# Patient Record
Sex: Male | Born: 1950 | Race: White | Hispanic: No | Marital: Married
Health system: Midwestern US, Community
[De-identification: ages and names within clinical notes are randomized; demographics above are authoritative.]

## PROBLEM LIST (undated history)

## (undated) DIAGNOSIS — M170 Bilateral primary osteoarthritis of knee: Secondary | ICD-10-CM

---

## 2018-01-08 NOTE — Telephone Encounter (Signed)
noted 

## 2018-01-08 NOTE — Telephone Encounter (Signed)
Pt Name and Date of Birth Verified?  Recieived referral:  ____Internal  __X__ External (Name/hone/fax)  ____[facility])    DX:Bilat knee pain   [facility]   - Pt instructed to bring disc if outside of [employer] to appointment-Sending Disk     Is this Workers Comp?No     Have you seen a prior provider for this DX?   --If yes Where__________________    Records given to:  Elnita Maxwell   CB# [phone]  Scheduled 01/21/18 at 9:15[facility]  Thank you   Andris Baumann

## 2018-01-19 NOTE — Telephone Encounter (Signed)
[facility]'d [facility], given to Hulda Marin 01/19/18 6:56 AM

## 2018-01-21 ENCOUNTER — Ambulatory Visit: Admit: 2018-01-21 | Discharge: 2018-01-21 | Payer: TRICARE (CHAMPUS) | Attending: Orthopaedic Surgery

## 2018-01-21 ENCOUNTER — Ambulatory Visit: Attending: Orthopaedic Surgery

## 2018-01-21 DIAGNOSIS — M25561 Pain in right knee: Secondary | ICD-10-CM

## 2018-01-21 NOTE — Progress Notes (Signed)
This is an initial consultation request sent over by Dr. Shelah Lewandowsky at the [facility] in [place] for evaluation and treatment in of chronic bilateral knee pain.  Please cc primary care provider outlining our findings diagnostic imaging and treatment recommendations.  Please thank them for sending this consultation.    CC: Bilateral knee pain    HPI: The patient is a 67 y.o.-year-old male who presents today for their first orthopaedic evaluation ofbilateral knee  pain.  The patient states that the pain has been an issue for many years.  Patient states he has chronic ongoing lifestyle limiting knee pain that has been recalcitrant to nonoperative measures to include anti-inflammatories activity modification bracing and cortisone injections.  He states the cortisone injections he last had was last fall and they do not work as well as they had been previously.  Patient also has spine issues to include a previous spine fracture in a cervical spine and status post multiple surgeries.  He currently gets cortisone injections into his spine which do work for the back pain however he was told to come and see [place] in consultation for his ongoing knee pain for potential other treatment options. Patient does not describe any instability but he does have some mechanical symptoms to include clicking and locking and crepitus.. Currently the pain is rated at 5/10. The pain is described as sharp in nature on occasion with twisting but otherwise dull and achy with some mechanical symptoms.  Pain is worse with walking and prolonged activity and improved by cortisone injections previously.No injury. No recent illness, fevers, chills or night sweats.    History reviewed. No pertinent past medical history.    Current Outpatient Medications:   ???  ergocalciferol (ERGOCALCIFEROL) 50,000 unit capsule, , Disp: , Rfl:   ???  fluticasone propionate (FLONASE) 50 mcg/actuation nasal spray, , Disp: , Rfl:    ???  losartan (COZAAR) 100 mg tablet, , Disp: , Rfl:   ???  TOPROL XL 200 mg XL tablet, , Disp: , Rfl:   ???  pantoprazole (PROTONIX) 40 mg tablet, , Disp: , Rfl:   ???  pravastatin (PRAVACHOL) 80 mg tablet, , Disp: , Rfl:   ???  sertraline (ZOLOFT) 25 mg tablet, , Disp: , Rfl:   ???  cyanocobalamin (VITAMIN B-12) 500 mcg tablet, Take 500 mcg by mouth daily., Disp: , Rfl:   Allergies   Allergen Reactions   ??? Versed [Midazolam] Other (comments)     Anger, agitation     History reviewed. No pertinent surgical history.  History reviewed. No pertinent family history.  Social History     Socioeconomic History   ??? Marital status: MARRIED     Spouse name: Not on file   ??? Number of children: Not on file   ??? Years of education: Not on file   ??? Highest education level: Not on file   Occupational History   ??? Not on file   Social Needs   ??? Financial resource strain: Not on file   ??? Food insecurity:     Worry: Not on file     Inability: Not on file   ??? Transportation needs:     Medical: Not on file     Non-medical: Not on file   Tobacco Use   ??? Smoking status: Never Smoker   ??? Smokeless tobacco: Never Used   Substance and Sexual Activity   ??? Alcohol use: Not on file   ??? Drug use: Not on file   ???  Sexual activity: Not on file   Lifestyle   ??? Physical activity:     Days per week: Not on file     Minutes per session: Not on file   ??? Stress: Not on file   Relationships   ??? Social connections:     Talks on phone: Not on file     Gets together: Not on file     Attends religious service: Not on file     Active member of club or organization: Not on file     Attends meetings of clubs or organizations: Not on file     Relationship status: Not on file   ??? Intimate partner violence:     Fear of current or ex partner: Not on file     Emotionally abused: Not on file     Physically abused: Not on file     Forced sexual activity: Not on file   Other Topics Concern   ??? Not on file   Social History Narrative   ??? Not on file        ROS: A 12-point review of systems was reviewed with the patient and is available in further detail in the chart.    Examination:   Visit Vitals  BP 108/70 (BP 1 Location: Right arm, BP Patient Position: Sitting)   Pulse 76   Resp 16   Ht  (1.778 m)   Wt 221 lb (100.2 kg)   BMI 31.71 kg/m??     The patient is awake, alert and oriented times 3 and answering questions appropriately. The patient is atraumatic, normocephalic, extraocular muscles are intact, affect is friendly and breathing is unlaborded.  Examination of bilateral lower extremities reveals neurovascularly intact limbs. Skin is warm, dry and intact. There is no effusion. Range of motion of the both knees is from 0 - 120. There is 5/5 muscle strength in EHL/FHL. There is no ligamentous laxity. Negative anterior and posterior draw. No laxity to valgus or varus stress at both 0 and 30 degrees. Sensation is intact. No lower extremity edema.   Overall the patient has no effusion today is stable ligamentous exam with an excellent range of motion. He has some medial joint line tenderness to palpation and some mild tenderness to palpation over bilateral patellar tendons but no other bony tenderness to palpation or bony prominences or tendinous insertions.  He otherwise remains distally neurovascular intact without venous stasis lymphedema or venous varicosities.    Radiographs:  My interpretation of outside radiographs that the patient provided [place] today show subchondral sclerosis osteophytosis and joint space narrowing bilaterally indicative of primary osteoarthritis Kellgren Lawrence grade 3.    Assessment:  Bilateral knee primary osteoarthritis    Plan:   I discussed the nature of this problem with the patient. We discussed treatment options which include cortisone injection, viscosupplementation, oral anti-inflammatories, glucosamine and chondroitin, tumeric, activity modification, and ultimately joint replacement. We have decided to proceed  with maximization of non operative measures with continued use of nutritional supplements, over-the-counter pain medications neoprene sleeves if indicated and patient desires as well as we have elected to proceed with hyaluronic acid injections.  The patient desires to proceed his insurance will cover however he is going out of town as he works currently as a Theme park manager and so he is going to schedule these for a later date.  I advised that the process of a 3 injection series and the patient desires to proceed. At this point I do  not think he needs a knee replacement so we are going to continue with her current regimen of nonoperative measures.  I also described for him genicular nerve rhizotomy in this may be something he may entertain in the future.  I will cc a copy this the primary care provider as well as the consulting thanking him for the consultation outlining our findings and letting know we will continue to see this patient for bilateral knee primary osteoarthritis.    The patient understands the plan and will follow up accordingly.

## 2018-02-27 ENCOUNTER — Encounter

## 2018-02-27 ENCOUNTER — Institutional Professional Consult (permissible substitution): Admit: 2018-02-27 | Discharge: 2018-02-27 | Attending: Orthopaedic Surgery

## 2018-02-27 DIAGNOSIS — M170 Bilateral primary osteoarthritis of knee: Secondary | ICD-10-CM

## 2018-02-27 MED ORDER — SODIUM HYALURONATE 16.8 MG/2 ML INTRA-ARTICULAR SYRINGE
16.8 mg/2 mL | Freq: Once | INTRA_ARTICULAR | Status: AC
Start: 2018-02-27 — End: 2018-02-27
  Administered 2018-02-27: 13:00:00 via INTRA_ARTICULAR

## 2018-02-27 NOTE — Progress Notes (Signed)
CC: Gelsyn Injection #2 Bilateral knees    HPI:  The patient is a 67 y.o.-year-old male who presents today for gelsyn injection #2 into Bilateral knees. Pain today is reported at 6/10.    Examination:  Examination of bilateral lower extremities reveals neurovascularly intact limbs. Skin is cool, dry and intact. There is no knee effusion    Assessment: Bilateral knee primary osteoarthritis     Plan: I advised the patient of the risks, benefits and alternatives to Gelsyn injection (to include redness, pain, swelling, flare pain and infection). Consent was signed for bilateral knee Gelsyn injection. After site verification under sterile conditions, 3 mL of lidocaine was pre-injected and then 2mL of Gelsyn was injected intra-articularly into the right knee. The patient tolerated the procedure well. Typical postinjection instructions were given.    Then under sterile conditions, 3 mL of lidocaine was pre-injected and then 2mL of Gelsyn was injected intra-articularly into the left knee. The patient tolerated the procedure well. Typical postinjection instructions were given.    Follow-up 1 week

## 2018-02-27 NOTE — Progress Notes (Signed)
Procedure Time       Pre-procedure checklist:      [x]   Patient identity confirmed using (2) patient identifiers  [x]   Documenation in chart agrees with intended procedure  [x]   Consent is signed and agrees with chart and patient  [x]   Relevant images and test results are labeled and correctly displayed  []   Any required special equipment, blood products, devices, and implants for this           procedure are available and accurately matched to the patient  []   Site is marked as required per policy    Confirmation that patient has taken prescribed pre-procedure medications:    []   Ciprofloxacin 500 mg oral taken prior to the procedure today  []   Levaquin 500 mg oral taken prior to procedure   []   Lorazepam     mg oral taken prior othe procedure today. Confirm paitent has a driver       with him/her today   []   Macrobid 250 mg oral taken prior to the procedure today  []   Other ( list exact name and dose) oral taken prior to procedure day:  []   No premedications ordered    Timeout completed by the provider and assistant immediatily prior to the procedure:    [x]   Patient identity confirmed using (2) patient identifiers  [x]   Documentation in chart agrees with intended proceudre  [x]   Consent is signed and agrees with chart and patient   [x]   Correct site and side has been marked by provider  []   Relevant images and test results are labeled and correctly displayed  []   Any required special equipment, blood products, devices, and implants for this                 procedure are available and accurately matched to the patient  []   Confirm the need to administer antibiotics or fluids for irrigation purposes  []   Any safety practices needed based on patient history or medication use      1% lidocaine lot # [id number] exp 07/10/2020, ndc [id number] [employer] ,gelsyn lot # [id number] exp 08/08/20, bilateral knee  Zadie Rhine      Self Administered or Unclassified Drugs  Self Administered or Unclassified  Drugs  Medication Name: 1% lidocaine  Expiration Date: 07/10/20  Dose: 3cc  Manufacturer: [employer]  Lot#: [id number]  NDC#: 84132-440-10  Performed by/Witnessed by: DrNewman/cfarris, each knee

## 2018-02-27 NOTE — Progress Notes (Signed)
CC: Gelsyn Injection #2 Bilateral knees    HPI:  The patient is a 67 y.o.-year-old male who presents today for gelsyn injection #2 into Bilateral knees. Pain today is reported at 6/10.    Examination:  Examination of bilateral lower extremities reveals neurovascularly intact limbs. Skin is cool, dry and intact. There is no knee effusion    Assessment: Bilateral knee primary osteoarthritis     Plan: I advised the patient of the risks, benefits and alternatives to Gelsyn injection (to include redness, pain, swelling, flare pain and infection). Consent was signed for bilateral knee Gelsyn injection. After site verification under sterile conditions, 3 mL of lidocaine was pre-injected and then 2mL of Gelsyn was injected intra-articularly into the right knee. The patient tolerated the procedure well. Typical postinjection instructions were given.    Then under sterile conditions, 3 mL of lidocaine was pre-injected and then 2mL of Gelsyn was injected intra-articularly into the left knee. The patient tolerated the procedure well. Typical postinjection instructions were given.    Follow-up 1 week

## 2018-02-27 NOTE — Progress Notes (Signed)
Procedure Time       Pre-procedure checklist:      [x]   Patient identity confirmed using (2) patient identifiers  [x]   Documenation in chart agrees with intended procedure  [x]   Consent is signed and agrees with chart and patient  [x]   Relevant images and test results are labeled and correctly displayed  []   Any required special equipment, blood products, devices, and implants for this           procedure are available and accurately matched to the patient  []   Site is marked as required per policy    Confirmation that patient has taken prescribed pre-procedure medications:    []   Ciprofloxacin 500 mg oral taken prior to the procedure today  []   Levaquin 500 mg oral taken prior to procedure   []   Lorazepam     mg oral taken prior othe procedure today. Confirm paitent has a driver       with him/her today   []   Macrobid 250 mg oral taken prior to the procedure today  []   Other ( list exact name and dose) oral taken prior to procedure day:  []   No premedications ordered    Timeout completed by the provider and assistant immediatily prior to the procedure:    [x]   Patient identity confirmed using (2) patient identifiers  [x]   Documentation in chart agrees with intended proceudre  [x]   Consent is signed and agrees with chart and patient   [x]   Correct site and side has been marked by provider  []   Relevant images and test results are labeled and correctly displayed  []   Any required special equipment, blood products, devices, and implants for this                 procedure are available and accurately matched to the patient  []   Confirm the need to administer antibiotics or fluids for irrigation purposes  []   Any safety practices needed based on patient history or medication use      1% lidocaine lot # [id number] exp 07/10/2020, ndc [id number] [employer] ,gelsyn lot # [id number] exp 08/08/20, bilateral knee  Andrew Daniels      Self Administered or Unclassified Drugs  Self Administered or Unclassified Drugs   Medication Name: 1% lidocaine  Expiration Date: 07/10/20  Dose: 3cc  Manufacturer: [employer]  Lot#: [id number]  NDC#: [id number]  Performed by/Witnessed by: DrNewman/cfarris, each knee

## 2018-03-06 ENCOUNTER — Encounter

## 2018-03-06 ENCOUNTER — Institutional Professional Consult (permissible substitution): Admit: 2018-03-06 | Discharge: 2018-03-06 | Attending: Orthopaedic Surgery

## 2018-03-06 DIAGNOSIS — M170 Bilateral primary osteoarthritis of knee: Secondary | ICD-10-CM

## 2018-03-06 MED ORDER — SODIUM HYALURONATE 16.8 MG/2 ML INTRA-ARTICULAR SYRINGE
16.8 mg/2 mL | Freq: Once | INTRA_ARTICULAR | Status: AC
Start: 2018-03-06 — End: 2018-03-06
  Administered 2018-03-06: 14:00:00 via INTRA_ARTICULAR

## 2018-03-06 NOTE — Progress Notes (Signed)
CC: Gelsyn Injection #2 Bilateral knees    HPI:  The patient is a 67 y.o.-year-old male who presents today for gelsyn injection #2 into Bilateral knees. Pain today is reported at 4/10.    Examination:  Examination of bilateral lower extremities reveals neurovascularly intact limbs. Skin is cool, dry and intact. There is no knee effusion    Assessment: Bilateral knee primary osteoarthritis     Plan: I advised the patient of the risks, benefits and alternatives to Gelsyn injection (to include redness, pain, swelling, flare pain and infection). Consent was signed for bilateral knee Gelsyn injection. After site verification under sterile conditions, 3 mL of lidocaine was pre-injected and then 2mL of Gelsyn was injected intra-articularly into the right knee. The patient tolerated the procedure well. Typical postinjection instructions were given.    Then under sterile conditions, 3 mL of lidocaine was pre-injected and then 2mL of Gelsyn was injected intra-articularly into the left knee. The patient tolerated the procedure well. Typical postinjection instructions were given.    Follow-up 1 week

## 2018-03-06 NOTE — Progress Notes (Signed)
Procedure Time       Pre-procedure checklist:      [x]   Patient identity confirmed using (2) patient identifiers  [x]   Documenation in chart agrees with intended procedure  [x]   Consent is signed and agrees with chart and patient  [x]   Relevant images and test results are labeled and correctly displayed  []   Any required special equipment, blood products, devices, and implants for this           procedure are available and accurately matched to the patient  []   Site is marked as required per policy    Confirmation that patient has taken prescribed pre-procedure medications:    []   Ciprofloxacin 500 mg oral taken prior to the procedure today  []   Levaquin 500 mg oral taken prior to procedure   []   Lorazepam     mg oral taken prior othe procedure today. Confirm paitent has a driver       with him/her today   []   Macrobid 250 mg oral taken prior to the procedure today exp /  []   Other ( list exact name and dose) oral taken prior to procedure day:  []   No premedications ordered    Timeout completed by the provider and assistant immediatily prior to the procedure:    [x]   Patient identity confirmed using (2) patient identifiers  [x]   Documentation in chart agrees with intended proceudre  [x]   Consent is signed and agrees with chart and patient   [x]   Correct site and side has been marked by provider  []   Relevant images and test results are labeled and correctly displayed  []   Any required special equipment, blood products, devices, and implants for this                 procedure are available and accurately matched to the patient  []   Confirm the need to administer antibiotics or fluids for irrigation purposes  []   Any safety practices needed based on patient history or medication use    1% lidocaine lot # [id number] exp 07/10/2020, ndc [id number] [employer] ,[place] lot # [id number] exp 10/09/2009, bilateral knee  Zadie Rhineheryl Huntley Knoop         Self Administered or Unclassified Drugs   Self Administered or Unclassified Drugs  Medication Name: 1% lidocaine  Expiration Date: 07/10/20  Dose: 3cc  Manufacturer: [employer]  Lot#: [id number]  NDC#: [id number]  Performed by/Witnessed by: DRNewman/cfarris each knee

## 2018-03-06 NOTE — Progress Notes (Signed)
CC: Gelsyn Injection #2 Bilateral knees    HPI:  The patient is a 67 y.o.-year-old male who presents today for gelsyn injection #2 into Bilateral knees. Pain today is reported at 4/10.    Examination:  Examination of bilateral lower extremities reveals neurovascularly intact limbs. Skin is cool, dry and intact. There is no knee effusion    Assessment: Bilateral knee primary osteoarthritis     Plan: I advised the patient of the risks, benefits and alternatives to Gelsyn injection (to include redness, pain, swelling, flare pain and infection). Consent was signed for bilateral knee Gelsyn injection. After site verification under sterile conditions, 3 mL of lidocaine was pre-injected and then 2mL of Gelsyn was injected intra-articularly into the right knee. The patient tolerated the procedure well. Typical postinjection instructions were given.    Then under sterile conditions, 3 mL of lidocaine was pre-injected and then 2mL of Gelsyn was injected intra-articularly into the left knee. The patient tolerated the procedure well. Typical postinjection instructions were given.    Follow-up 1 week

## 2018-03-06 NOTE — Progress Notes (Signed)
Procedure Time       Pre-procedure checklist:      [x]   Patient identity confirmed using (2) patient identifiers  [x]   Documenation in chart agrees with intended procedure  [x]   Consent is signed and agrees with chart and patient  [x]   Relevant images and test results are labeled and correctly displayed  []   Any required special equipment, blood products, devices, and implants for this           procedure are available and accurately matched to the patient  []   Site is marked as required per policy    Confirmation that patient has taken prescribed pre-procedure medications:    []   Ciprofloxacin 500 mg oral taken prior to the procedure today  []   Levaquin 500 mg oral taken prior to procedure   []   Lorazepam     mg oral taken prior othe procedure today. Confirm paitent has a driver       with him/her today   []   Macrobid 250 mg oral taken prior to the procedure today exp /  []   Other ( list exact name and dose) oral taken prior to procedure day:  []   No premedications ordered    Timeout completed by the provider and assistant immediatily prior to the procedure:    [x]   Patient identity confirmed using (2) patient identifiers  [x]   Documentation in chart agrees with intended proceudre  [x]   Consent is signed and agrees with chart and patient   [x]   Correct site and side has been marked by provider  []   Relevant images and test results are labeled and correctly displayed  []   Any required special equipment, blood products, devices, and implants for this                 procedure are available and accurately matched to the patient  []   Confirm the need to administer antibiotics or fluids for irrigation purposes  []   Any safety practices needed based on patient history or medication use    1% lidocaine lot # [id number] exp 07/10/2020, ndc [id number] [employer] ,[place] lot # [id number] exp 10/09/2009, bilateral knee  Zadie Rhineheryl Farris         Self Administered or Unclassified Drugs  Self Administered or Unclassified  Drugs  Medication Name: 1% lidocaine  Expiration Date: 07/10/20  Dose: 3cc  Manufacturer: [employer]  Lot#: [id number]  NDC#: [id number]  Performed by/Witnessed by: DRNewman/cfarris each knee

## 2018-03-16 NOTE — Telephone Encounter (Signed)
Called and moved

## 2018-03-16 NOTE — Telephone Encounter (Signed)
Andrew Daniels is a 67 y.o. male     Patient calling ,he needs to r/s his   #3 Gelsyn injection on 03/18/18    He could do 03/18/18 late afternoon , but he has a work conflict and can not do the morning  He thinks he would be able to do Friday afternoon    Please call with new appt  I did try to call to see where I could add him but no answer , sorry    Telephone Information:   Mobile [phone]     Threasa Beardsarol A Leblanc

## 2018-03-18 ENCOUNTER — Institutional Professional Consult (permissible substitution): Admit: 2018-03-18 | Discharge: 2018-03-18 | Payer: TRICARE (CHAMPUS) | Attending: Orthopaedic Surgery

## 2018-03-18 DIAGNOSIS — M170 Bilateral primary osteoarthritis of knee: Secondary | ICD-10-CM

## 2018-03-18 MED ORDER — SODIUM HYALURONATE 16.8 MG/2 ML INTRA-ARTICULAR SYRINGE
16.8 mg/2 mL | Freq: Once | INTRA_ARTICULAR | Status: AC
Start: 2018-03-18 — End: 2018-03-18
  Administered 2018-03-18: 20:00:00 via INTRA_ARTICULAR

## 2018-03-18 NOTE — Progress Notes (Signed)
Procedure Time       Pre-procedure checklist:      [x]   Patient identity confirmed using (2) patient identifiers  [x]   Documenation in chart agrees with intended procedure  [x]   Consent is signed and agrees with chart and patient  [x]   Relevant images and test results are labeled and correctly displayed  []   Any required special equipment, blood products, devices, and implants for this           procedure are available and accurately matched to the patient  []   Site is marked as required per policy    Confirmation that patient has taken prescribed pre-procedure medications:    []   Ciprofloxacin 500 mg oral taken prior to the procedure today  []   Levaquin 500 mg oral taken prior to procedure   []   Lorazepam     mg oral taken prior othe procedure today. Confirm paitent has a driver       with him/her today   []   Macrobid 250 mg oral taken prior to the procedure today  []   Other ( list exact name and dose) oral taken prior to procedure day:  []   No premedications ordered    Timeout completed by the provider and assistant immediatily prior to the procedure:    [x]   Patient identity confirmed using (2) patient identifiers  [x]   Documentation in chart agrees with intended proceudre  [x]   Consent is signed and agrees with chart and patient   [x]   Correct site and side has been marked by provider  []   Relevant images and test results are labeled and correctly displayed  []   Any required special equipment, blood products, devices, and implants for this                 procedure are available and accurately matched to the patient  []   Confirm the need to administer antibiotics or fluids for irrigation purposes  []   Any safety practices needed based on patient history or medication use    1% lidocaine lot # [id number] exp 07/10/2020, ndc [id number] [employer] ,[place] lot # [id number] exp 10/09/2020  Zadie Rhineheryl Saksham Akkerman    Self Administered or Unclassified Drugs  Self Administered or Unclassified Drugs   Medication Name: 1% lidocaine  Expiration Date: 07/10/20  Dose: 3cc  Manufacturer: [employer]  Lot#: [id number]  NDC#: [id number]  Performed by/Witnessed by: Dr Nadara EatonNewman/cfarris,each knee

## 2018-03-18 NOTE — Progress Notes (Signed)
CC: Gelsyn Injection #3 Bilateral knees    HPI:  The patient is a 67 y.o.-year-old male who presents today for gelsyn injection #3 into Bilateral knees. Pain today is reported at 0/10.    Examination:  Examination of bilateral lower extremities reveals neurovascularly intact limbs. Skin is cool, dry and intact. There is no knee effusion    Assessment: Bilateral knee primary osteoarthritis     Plan: I advised the patient of the risks, benefits and alternatives to Gelsyn injection (to include redness, pain, swelling, flare pain and infection). Consent was signed for bilateral knee Gelsyn injection. After site verification under sterile conditions, 3 mL of lidocaine was pre-injected and then 2mL of Gelsyn was injected intra-articularly into the right knee. The patient tolerated the procedure well. Typical postinjection instructions were given.    Then under sterile conditions, 3 mL of lidocaine was pre-injected and then 2mL of Gelsyn was injected intra-articularly into the left knee. The patient tolerated the procedure well. Typical postinjection instructions were given.    Follow-up 3 months

## 2018-03-18 NOTE — Progress Notes (Signed)
Procedure Time       Pre-procedure checklist:      [x]   Patient identity confirmed using (2) patient identifiers  [x]   Documenation in chart agrees with intended procedure  [x]   Consent is signed and agrees with chart and patient  [x]   Relevant images and test results are labeled and correctly displayed  []   Any required special equipment, blood products, devices, and implants for this           procedure are available and accurately matched to the patient  []   Site is marked as required per policy    Confirmation that patient has taken prescribed pre-procedure medications:    []   Ciprofloxacin 500 mg oral taken prior to the procedure today  []   Levaquin 500 mg oral taken prior to procedure   []   Lorazepam     mg oral taken prior othe procedure today. Confirm paitent has a driver       with him/her today   []   Macrobid 250 mg oral taken prior to the procedure today  []   Other ( list exact name and dose) oral taken prior to procedure day:  []   No premedications ordered    Timeout completed by the provider and assistant immediatily prior to the procedure:    [x]   Patient identity confirmed using (2) patient identifiers  [x]   Documentation in chart agrees with intended proceudre  [x]   Consent is signed and agrees with chart and patient   [x]   Correct site and side has been marked by provider  []   Relevant images and test results are labeled and correctly displayed  []   Any required special equipment, blood products, devices, and implants for this                 procedure are available and accurately matched to the patient  []   Confirm the need to administer antibiotics or fluids for irrigation purposes  []   Any safety practices needed based on patient history or medication use    1% lidocaine lot # [id number] exp 07/10/2020, ndc [id number] [employer] ,[place] lot # [id number] exp 10/09/2020  Zadie Rhineheryl Farris    Self Administered or Unclassified Drugs  Self Administered or Unclassified Drugs  Medication Name: 1%  lidocaine  Expiration Date: 07/10/20  Dose: 3cc  Manufacturer: [employer]  Lot#: [id number]  NDC#: [id number]  Performed by/Witnessed by: Dr Nadara EatonNewman/cfarris,each knee

## 2018-03-18 NOTE — Progress Notes (Signed)
CC: Gelsyn Injection #3 Bilateral knees    HPI:  The patient is a 67 y.o.-year-old male who presents today for gelsyn injection #3 into Bilateral knees. Pain today is reported at 0/10.    Examination:  Examination of bilateral lower extremities reveals neurovascularly intact limbs. Skin is cool, dry and intact. There is no knee effusion    Assessment: Bilateral knee primary osteoarthritis     Plan: I advised the patient of the risks, benefits and alternatives to Gelsyn injection (to include redness, pain, swelling, flare pain and infection). Consent was signed for bilateral knee Gelsyn injection. After site verification under sterile conditions, 3 mL of lidocaine was pre-injected and then 2mL of Gelsyn was injected intra-articularly into the right knee. The patient tolerated the procedure well. Typical postinjection instructions were given.    Then under sterile conditions, 3 mL of lidocaine was pre-injected and then 2mL of Gelsyn was injected intra-articularly into the left knee. The patient tolerated the procedure well. Typical postinjection instructions were given.    Follow-up 3 months

## 2018-11-30 ENCOUNTER — Encounter: Attending: Neurology

## 2018-12-28 ENCOUNTER — Encounter: Attending: Neurology

## 2019-02-15 ENCOUNTER — Ambulatory Visit: Payer: TRICARE (CHAMPUS) | Attending: Neurology

## 2021-01-04 IMAGING — MR MRI LEFT KNEE WITHOUT CONTRAST
4 of 5 series · 26 of 40 positions shown · IV contrast (gadolinium)
Comparison: 11/30/2020 [facility] radiographs

MRI LEFT KNEE WITHOUT CONTRAST, 01/04/2021 [DATE]: 
CLINICAL INDICATION: Left knee meniscus tear.
TECHNIQUE: Multiplanar, multiecho position MR images of the knee were performed 
without intravenous gadolinium enhancement.

[Series 201: survey_ · axial · 10.0mm · 1.17mm/px · z∈[-20,+149]mm · 3 of 9 slices shown]
[im 1/9]
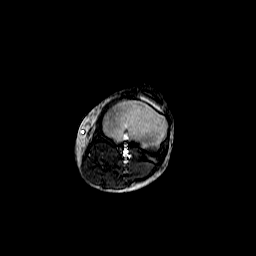
[im 5/9]
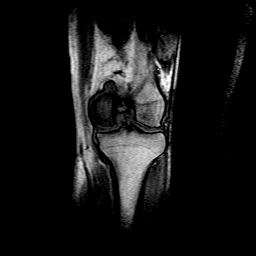
[im 9/9]
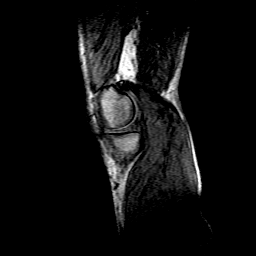

[Series 301: pdw spair sag · sagittal · 3.0mm · 0.50mm/px · 9 of 30 slices shown]
[im 1/30]
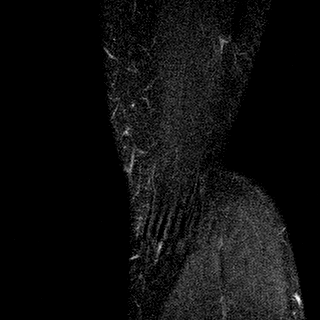
[im 4/30]
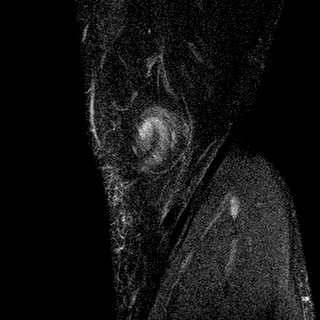
[im 8/30]
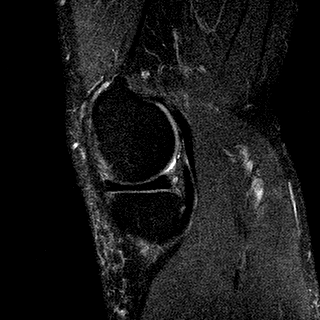
[im 11/30]
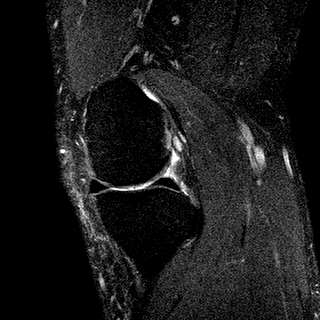
[im 15/30]
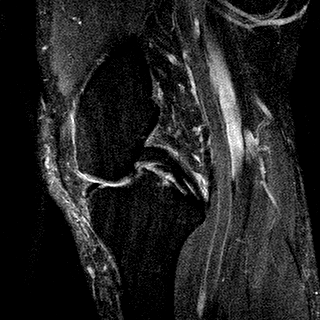
[im 19/30]
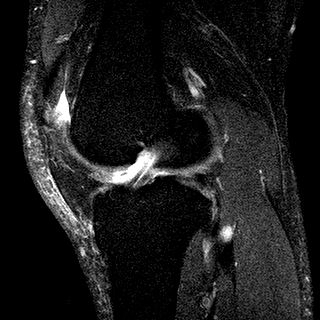
[im 22/30]
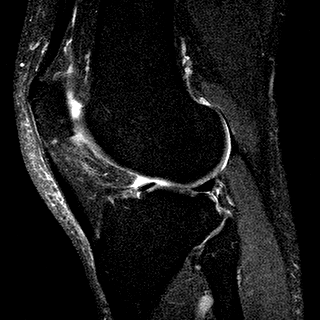
[im 26/30]
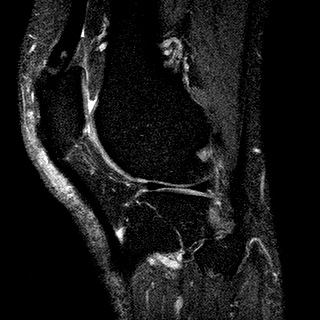
[im 30/30]
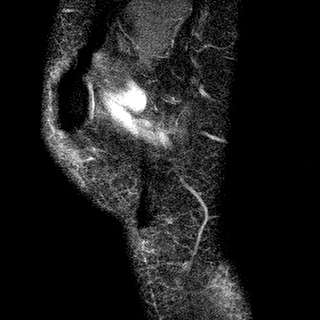

[Series 401: T1 · coronal · 3.0mm · 0.31mm/px · 9 of 33 slices shown]
[im 1/33]
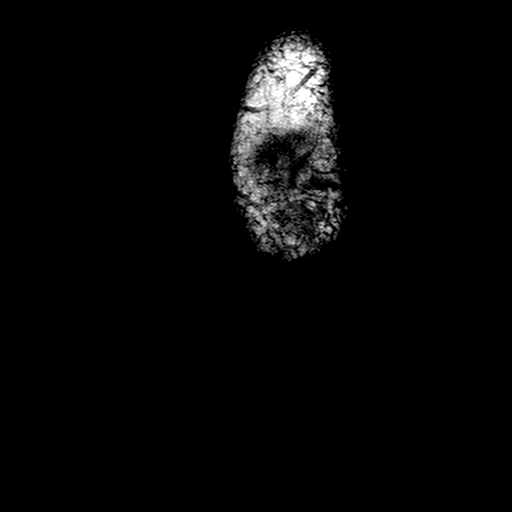
[im 5/33]
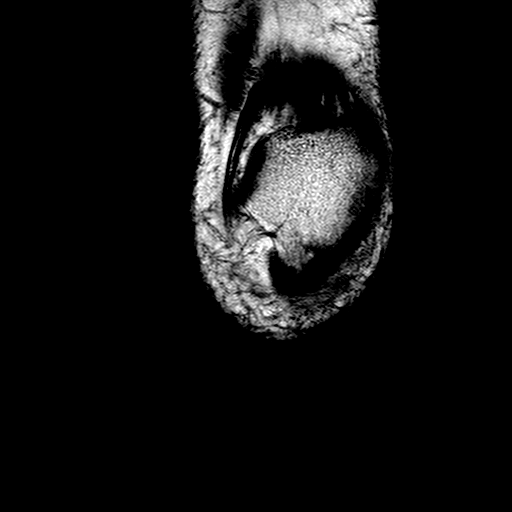
[im 9/33]
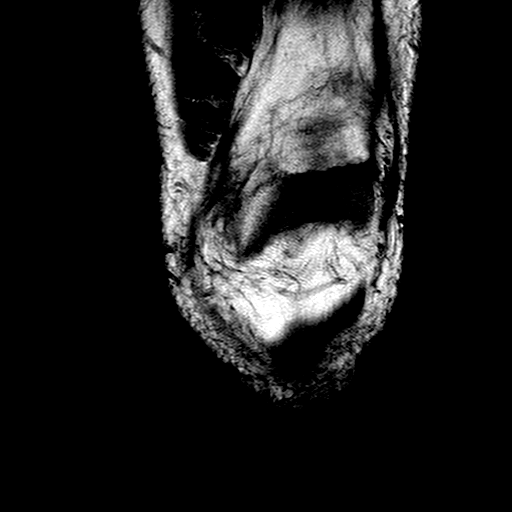
[im 13/33]
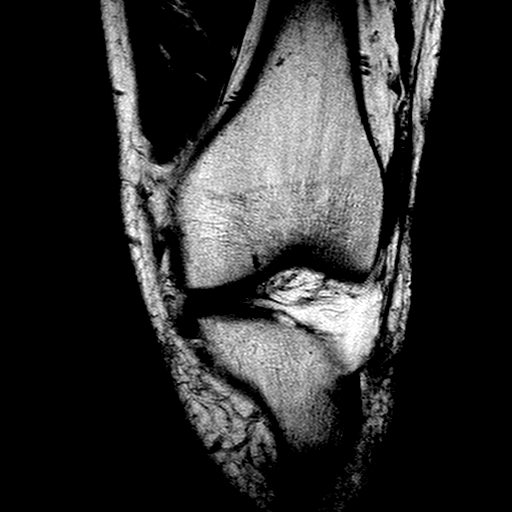
[im 17/33]
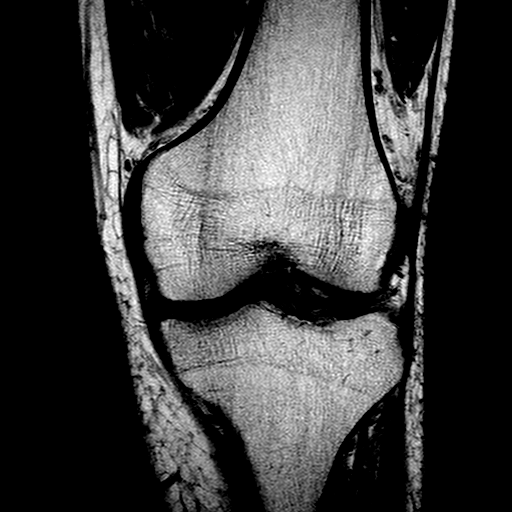
[im 21/33]
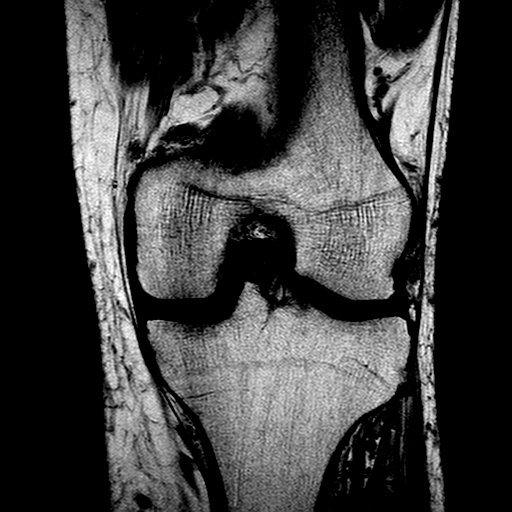
[im 25/33]
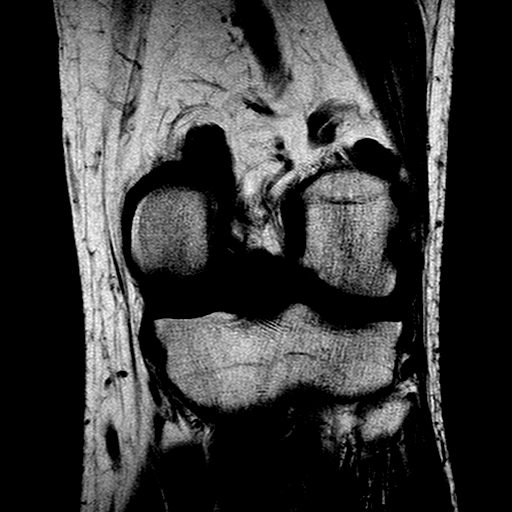
[im 29/33]
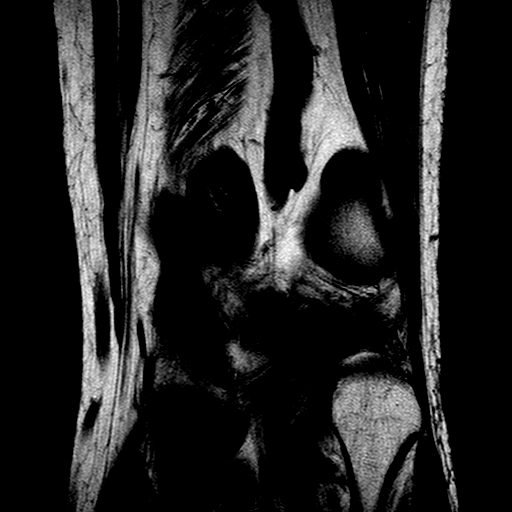
[im 33/33]
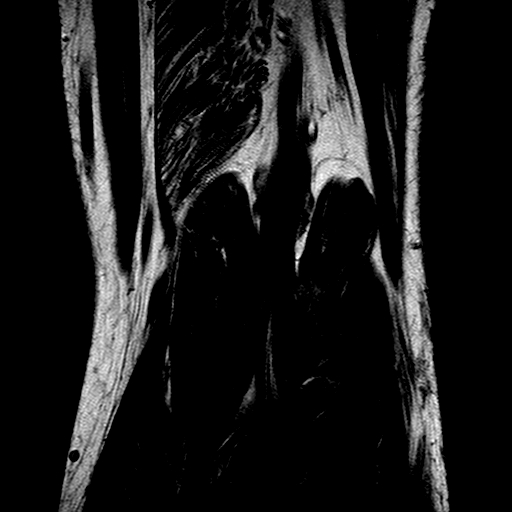

[Series 501: pdw spair cor · coronal · 3.0mm · 0.31mm/px · 5 of 33 slices shown]
[im 1/33]
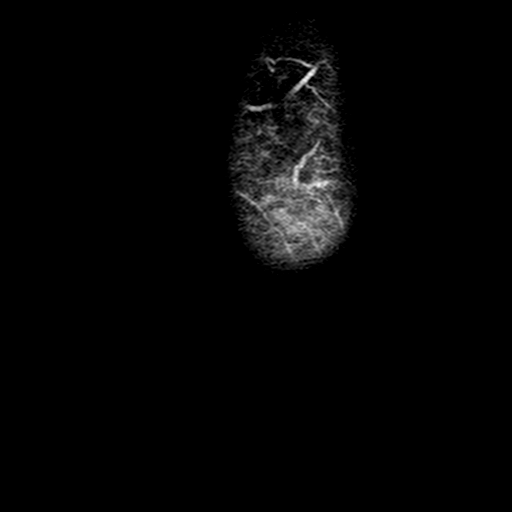
[im 5/33]
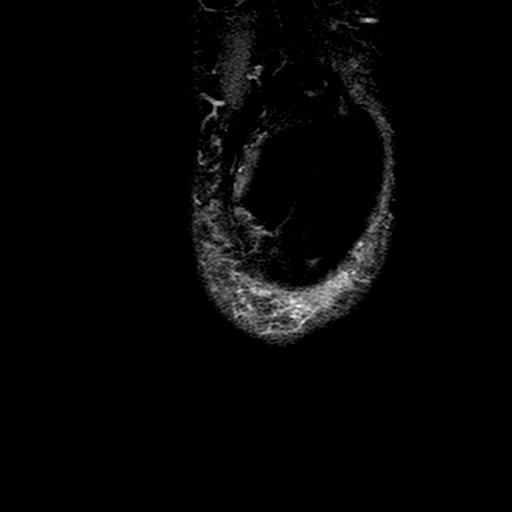
[im 9/33]
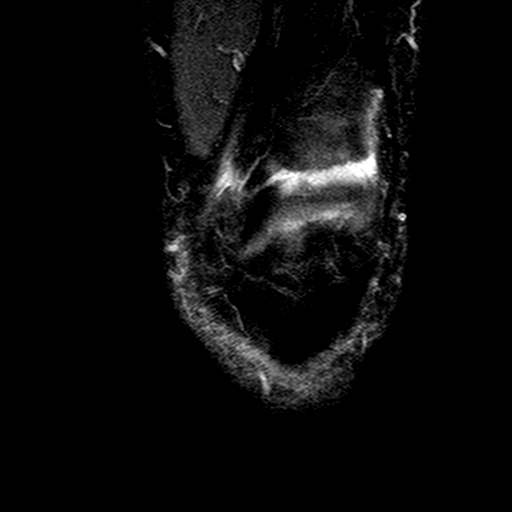
[im 17/33]
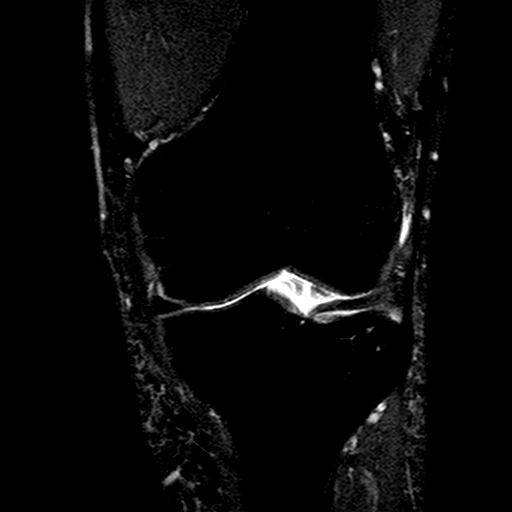
[im 29/33]
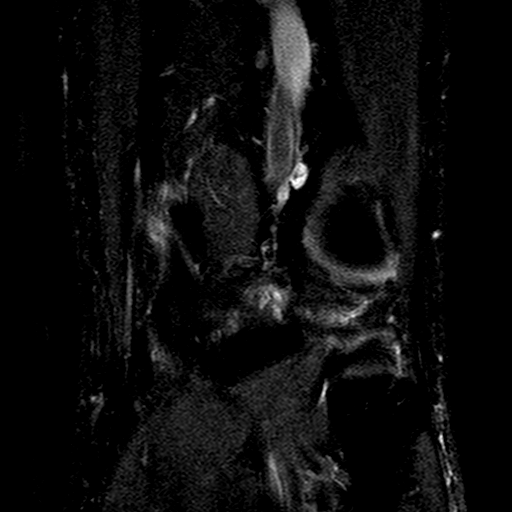

[26 of 40 positions shown; findings below may reference images not displayed]

FINDINGS: MEDIAL COMPARTMENT: The medial meniscus is intact without tear or extrusion. 
Subcentimeter focus of grade III chondromalacia involving the weightbearing 
surface of the medial femoral condyle. 
LATERAL COMPARTMENT: The lateral meniscus is intact without tear or extrusion. 
Articular cartilage is preserved. 
PATELLOFEMORAL COMPARTMENT: The patella is centrally located. Up to grade IV 
chondromalacia and tiny subcortical cysts in the patellar apex and medial facet. 
Up to grade II chondromalacia of the trochlea. 
TIBIOFIBULAR COMPARTMENT: Negative. 
LIGAMENTS: The anterior cruciate ligament is intact. 1.7 x 0.6 x 0.6 cm cruciate 
ganglion between the 2 bundles of the posterior cruciate ligament. The medial 
collateral ligament and lateral collateral ligaments are preserved. 
EXTENSOR MECHANISM: The quadriceps and patellar tendon are preserved. The medial 
and lateral retinacula are intact. 
POSTEROMEDIAL CORNER: The semimembranosus and pes anserine tendons are 
preserved. The posterior oblique ligament and posterior medial joint capsule are 
intact. 
POSTEROLATERAL CORNER: The popliteal tendon and popliteofibular ligament are 
intact. The biceps femoris is negative. 
BONES: Normal bone marrow signal intensity. No fracture or contusion or stress 
response. 
ADDITIONAL FINDINGS: No knee joint effusion. No popliteal cyst. The musculature 
is normal without mass, signal abnormality or atrophy. Neurovascular bundles are 
negative. Mild anterior subcutaneous soft tissue swelling.
IMPRESSION: 1.  No meniscal tear. 
2.  Moderate patellofemoral and mild medial compartment degenerative change. 
3.  PCL ganglion. 
4.  Mild anterior subcutaneous soft tissue swelling.

## 2022-07-28 IMAGING — MR MRI CERVICAL SPINE WITHOUT CONTRAST
6 of 9 series · 9 of 48 positions shown · IV contrast (gadolinium)
Comparison: None.

________________________________________________________________________________________________ 
MRI CERVICAL SPINE WITHOUT CONTRAST, 07/28/2022 [DATE]: 
CLINICAL INDICATION: Neck pain and acute lack of arm strength.
TECHNIQUE: Multiplanar, multiecho position MR images of the cervical spine were 
performed without intravenous gadolinium enhancement. Patient was scanned on a 
1.5T magnet.

[Series 201: survey · axial · 10.0mm · 1.25mm/px · 1 of 10 slices shown]
[im 1/10]
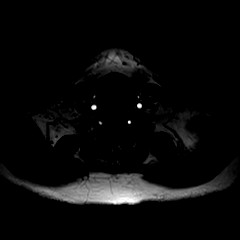

[Series 301: t2w_cor-surv · coronal · 5.0mm · 0.69mm/px · 1 of 7 slices shown]
[im 1/7]
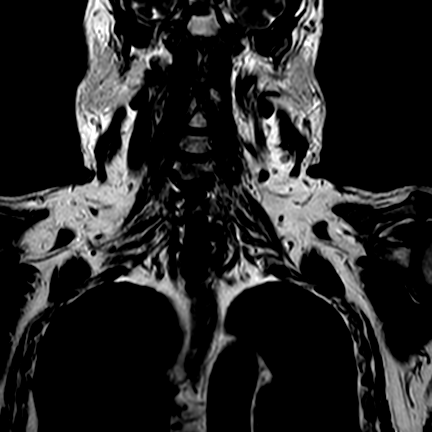

[Series 401: t1_sag · sagittal · 3.0mm · 0.39mm/px · 1 of 15 slices shown]
[im 1/15]
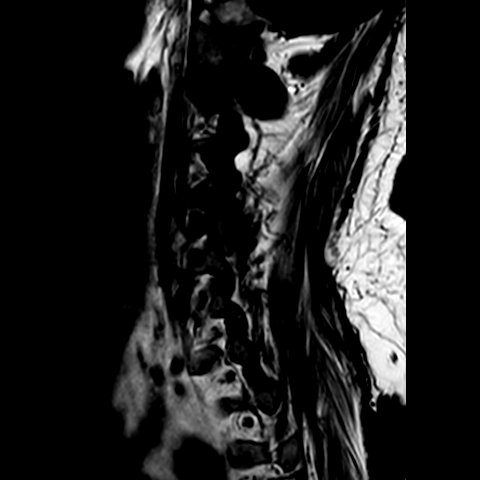

[Series 502: (id)_mdixon_tse · sagittal · 3.0mm · 0.42mm/px · 1 of 15 slices shown]
[im 1/15]
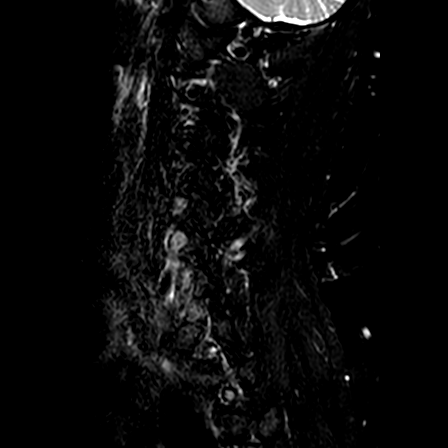

[Series 503: st2w_mdixon_tse · sagittal · 3.0mm · 0.42mm/px · 1 of 15 slices shown]
[im 1/15]
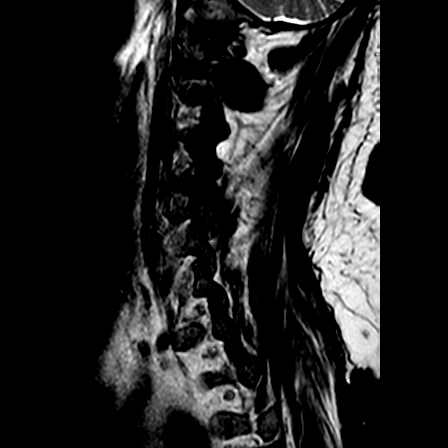

[Series 701: t2_(person_name)_(person_name) · axial · 3.0mm · 0.29mm/px · z∈[-260,-144]mm · 4 of 38 slices shown]
[im 1/38]
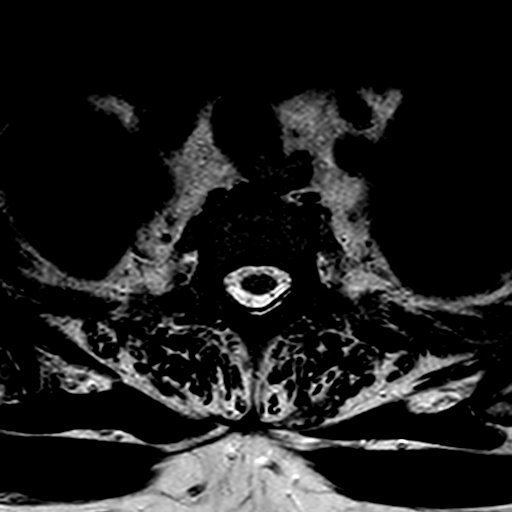
[im 13/38]
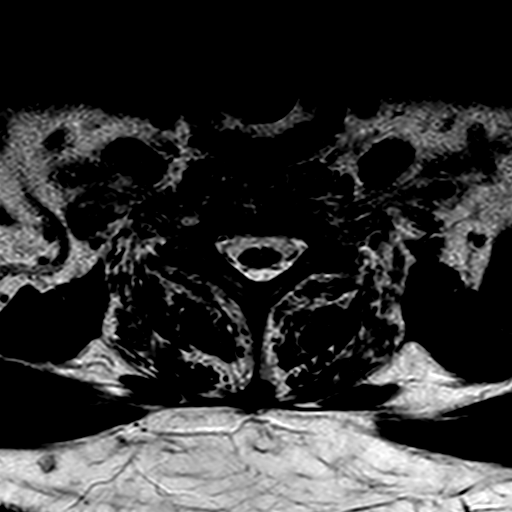
[im 25/38]
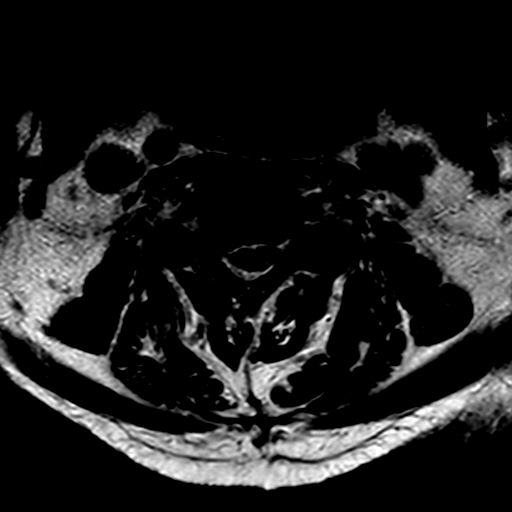
[im 38/38]
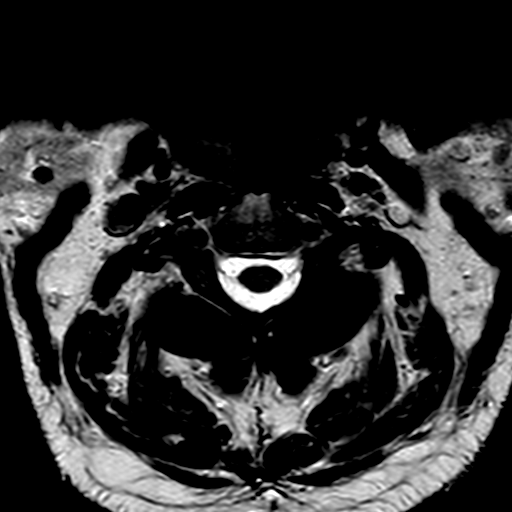

[9 of 48 positions shown; findings below may reference images not displayed]

FINDINGS: -------------------------------------------------------------------------------- 
----------------- 
GENERAL: 
POSTSURGICAL: Postsurgical changes of anterior cervical discectomy and hardware 
fusion from C5 through C7. 
ALIGNMENT: Minimal retrolisthesis of C4 relative to C5. 
VERTEBRAL BODY HEIGHT: Normal.  
MARROW SIGNAL: No focal suspect signal abnormality. 
CORD SIGNAL: Normal.  
ADDITIONAL FINDINGS: 1.6 cm left lower pole thyroid nodule. 
-------------------------------------------------------------------------------- 
---------------- 
SEGMENTAL: 
CRANIOCERVICAL JUNCTION: No significant stenosis. 
C2-C3: No significant central canal or neural foraminal narrowing. 
C3-C4: Disc osteophyte complex with left paracentral disc herniation. This abuts 
the left ventral cord with mild deformity of left ventral cord. Overall mild 
central canal narrowing. Bilateral uncovertebral joint hypertrophy. Left facet 
hypertrophy. Severe left and moderate right neural foraminal narrowing. 
C4-C5: Disc osteophyte complex with right uncovertebral joint hypertrophy 
greater than left. Bilateral facet hypertrophy. Mild central canal narrowing. 
There is deformity of the left lateral margin of the cord. Severe right neural 
foraminal narrowing. Severe left neural foraminal narrowing. 
C5-C6: ACDF across this level. Right uncovertebral joint hypertrophy. No 
significant central canal narrowing. Mild right neural foraminal narrowing. No 
significant left neural foraminal narrowing. 
C6-C7: ACDF across this level. No significant central canal or neural foraminal 
narrowing.  
C7-T1: Mild right facet hypertrophy. No significant central canal or neural 
foraminal narrowing.  
-------------------------------------------------------------------------------- 
---------------
IMPRESSION: 1.  Discogenic/degenerative and postsurgical changes as above. 
2.  Worst level(s) of central canal narrowing: C3-C4 (cord deformity), C4-C5 
(cord deformity). 
3.  Worst level(s) of neural foraminal narrowing: C3-C4 (severe left), C4-C5 
(severe bilateral). 
4.  Left thyroid nodule advise ultrasound correlation.

## 2022-12-25 IMAGING — MR MRI LUMBAR SPINE WITHOUT CONTRAST
7 of 9 series · 17 of 48 positions shown · IV contrast (gadolinium)
Comparison: No prior lumbar examination

________________________________________________________________________________________________ 
MRI LUMBAR SPINE WITHOUT CONTRAST, 12/25/2022 [DATE]: 
CLINICAL INDICATION: Chronic low back pain
TECHNIQUE: Sagittal T1, Sagittal T2, Sagittal STIR, Axial T1 and Axial T2 MR 
images of the lumbar spine were performed without intravenous gadolinium 
enhancement.

[Series 101: survey · axial · 10.0mm · 1.25mm/px · 1 of 10 slices shown]
[im 1/10]
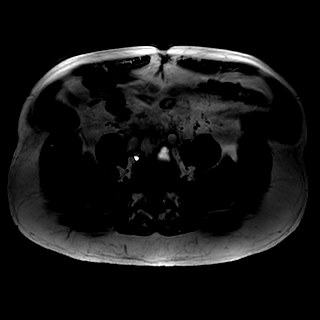

[Series 201: t2w_cor-surv · coronal · 6.0mm · 0.62mm/px · 1 of 10 slices shown]
[im 1/10]
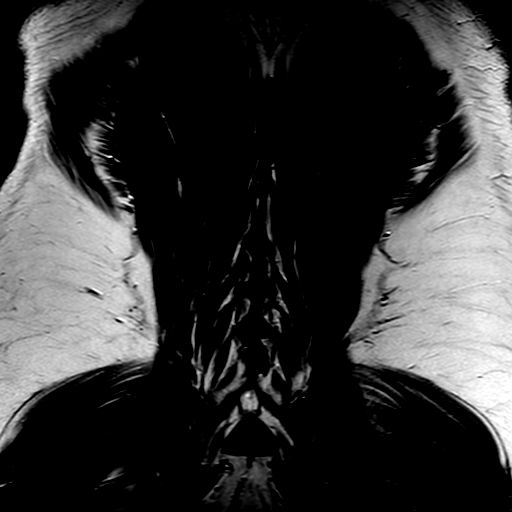

[Series 301: t1_tse_sag · sagittal · 4.0mm · 0.44mm/px · 2 of 19 slices shown]
[im 1/19]
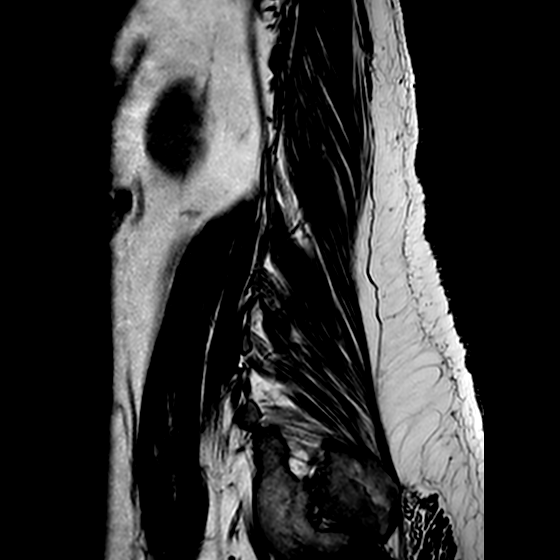
[im 19/19]
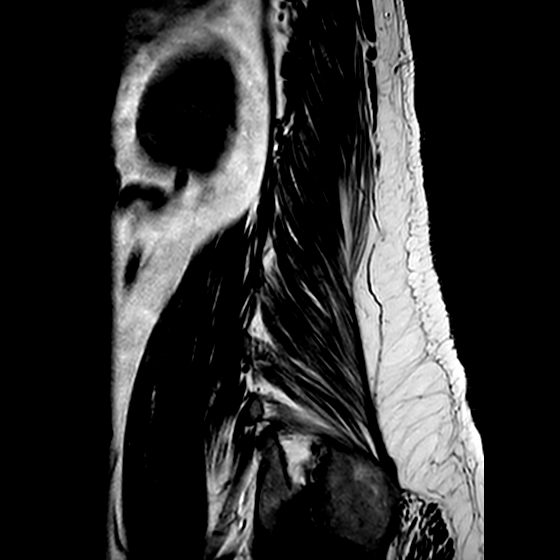

[Series 402: (id)_mdixon_tse · sagittal · 4.0mm · 0.51mm/px · 3 of 19 slices shown]
[im 1/19]
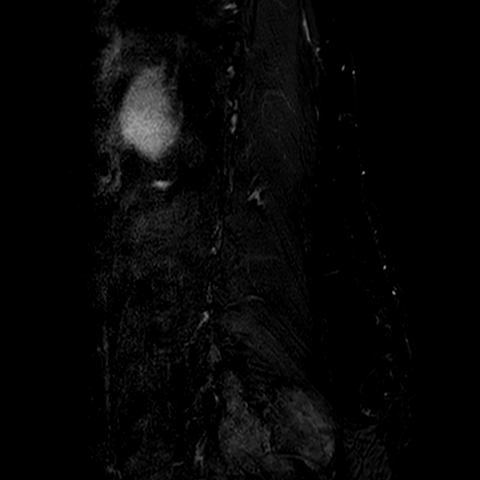
[im 10/19]
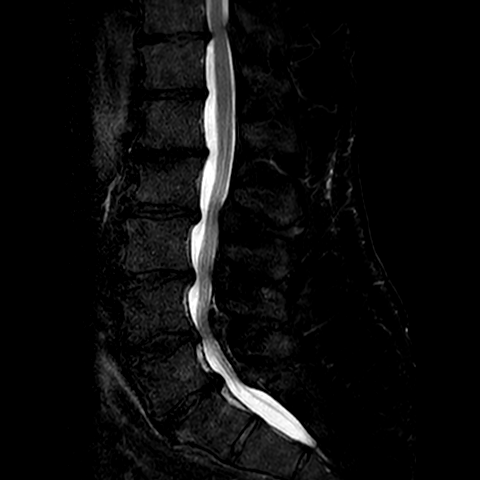
[im 19/19]
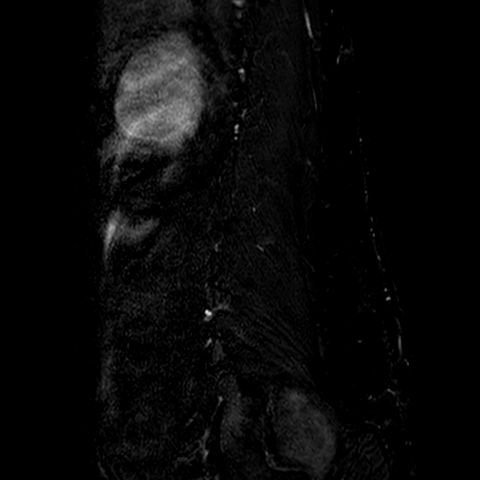

[Series 403: st2w_mdixon_tse · sagittal · 4.0mm · 0.51mm/px · 3 of 19 slices shown]
[im 1/19]
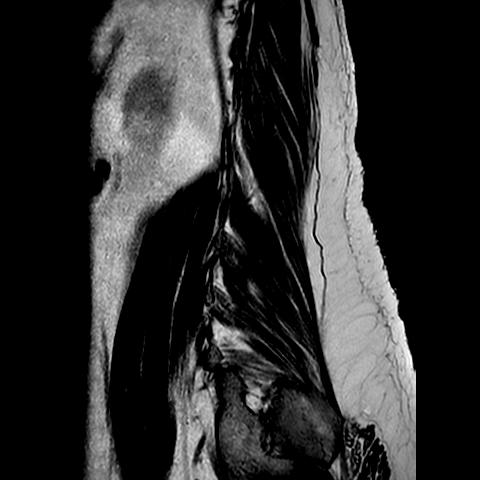
[im 10/19]
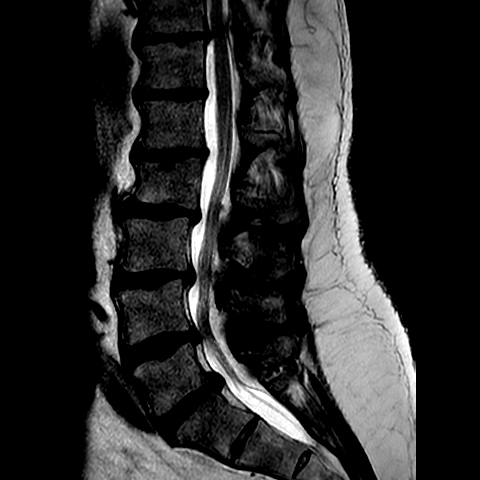
[im 19/19]
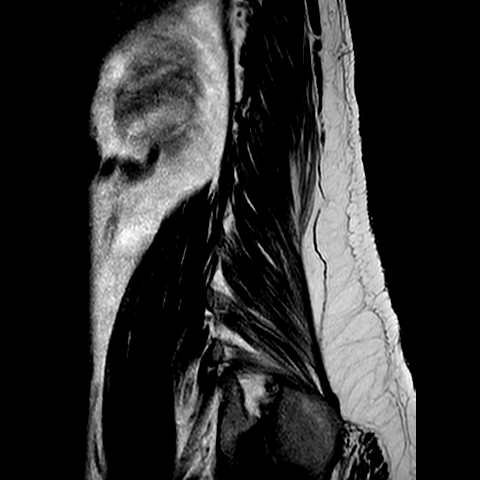

[Series 502: (id) view_ax mpr · axial · 1.0mm · 0.25mm/px · 1 of 93 slices shown]
[im 1/93]
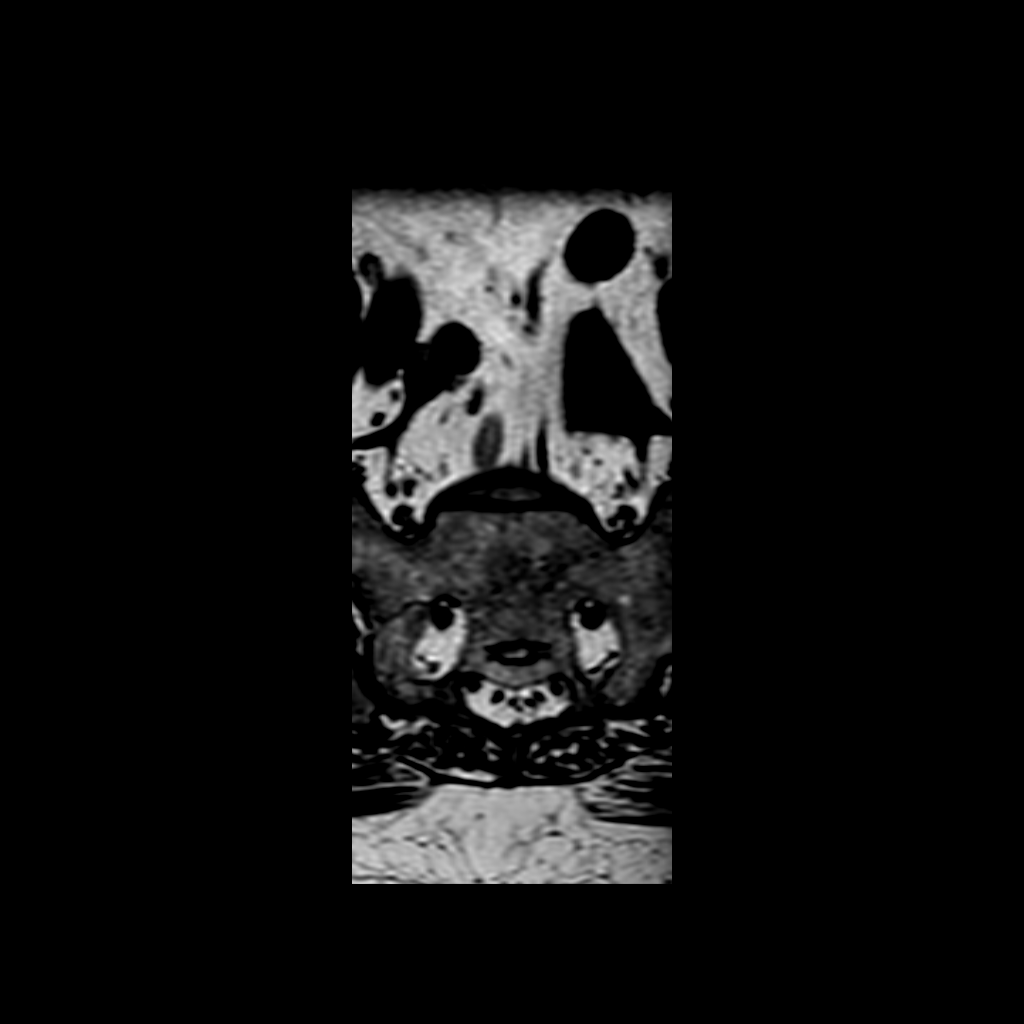

[Series 701: T1 · axial · 4.0mm · 0.39mm/px · z∈[-16,+164]mm · 6 of 42 slices shown]
[im 1/42]
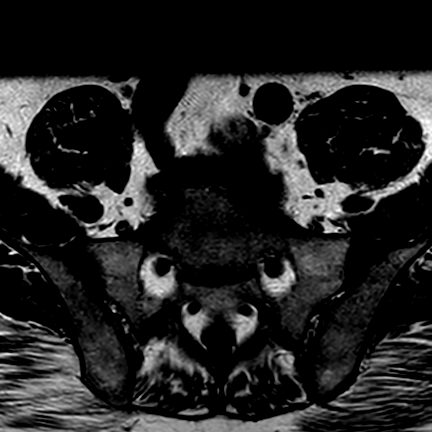
[im 9/42]
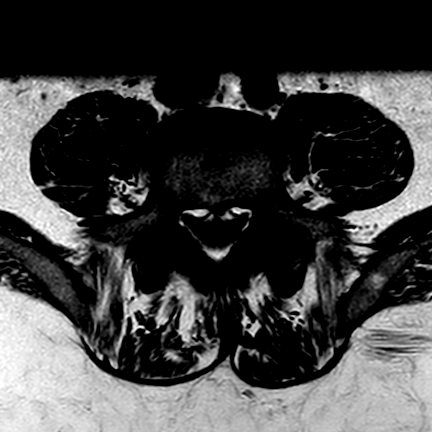
[im 17/42]
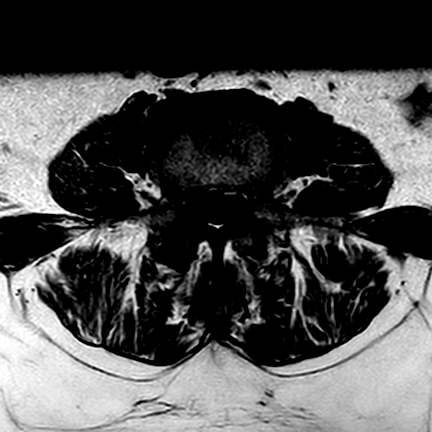
[im 25/42]
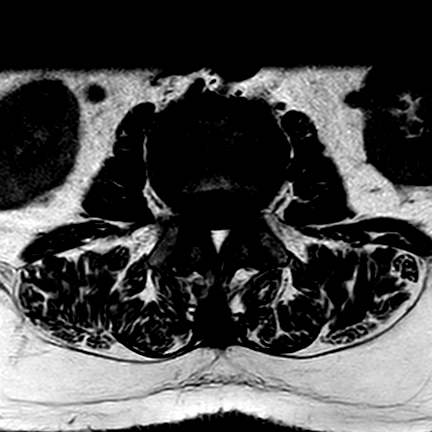
[im 33/42]
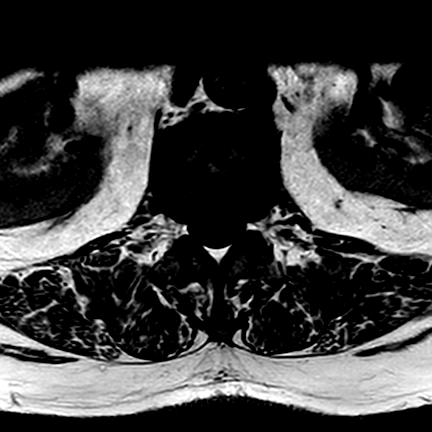
[im 42/42]
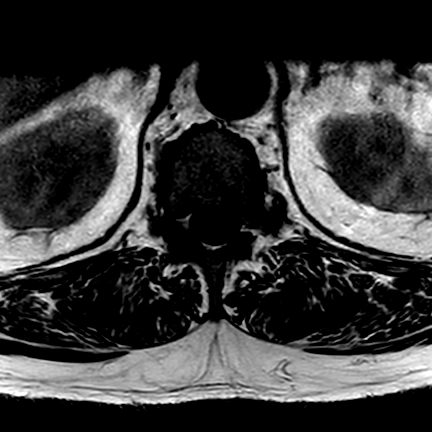

[17 of 48 positions shown; findings below may reference images not displayed]

FINDINGS: There is slight chronic wedge deformity of L2. There are several 
chronic Schmorls nodes. Other lumbar vertebral heights are intact. There is 
slight retrolisthesis at L2-3 and L3-4. The conus terminates opposite T12-L1. 
At T11-12 there is disc extrusion and dorsal ligamentous thickening deforming 
the cord. There is moderate-severe canal stenosis to the left of midline seen on 
sagittal image 9 where canal diameter and cord diameter is 3.5 mm. 
At T12-L1 the canal and foramina are open. 
At L1-2 there is mild disc bulge. The canal and foramina are open. 
At L2-3 there is mild disc bulge contributing to borderline-mild canal stenosis. 
Foramina are open. Moderate facet change with ligamentous thickening. 
At L3-4 there is broad-based disc bulge contributing to mild canal stenosis. 
There is mild encroachment on the upper L4 lateral recesses, axial T2 image 12. 
Moderate to marked facet change with ligamentous thickening. Foramina are open. 
At L4-5 there is moderate canal stenosis due to broad-based disc bulge with 
small component of subligamentous extrusion, dorsal epidural lipomatosis and 
moderate to marked facet change with ligamentous thickening, axial image 8. 
There is mild to moderate bilateral foraminal stenosis. 
At L5-S1 there is central and left-sided disc bulge, likely with small component 
of extrusion. There is encroachment on the S1 nerve roots as they of the lateral 
recesses, axial T2 image 2. There is mild canal stenosis. Marked left, moderate 
right foraminal stenosis. Marked facet degenerative change. 
Mild Modic type I change at L5-S1. There is degenerative reactive edema 
involving the L5-S1 facets and extending into the pedicles bilaterally.
IMPRESSION: Multilevel degenerative changes as described. There is moderate-severe canal 
stenosis at T11-12 with cord deformity. 
Moderate canal stenosis at L4-5. 
Impingement of the S1 nerve roots bilaterally at L5-S1 with mild canal stenosis. 
There is mild canal stenosis at L3-4 encroaching on the L4 nerve roots. 
Modic type I change at L5-S1. 
No evidence for malignancy. There are chronic Schmorls nodes. Slight chronic 
wedge deformity of L2.

## 2022-12-25 IMAGING — CT CT LUMBAR SPINE WITHOUT CONTRAST
3 series · 13 of 33 positions shown, 16 images · non-contrast
Comparison: No prior lumbar examination

________________________________________________________________________________________________ 
CT LUMBAR SPINE WITHOUT CONTRAST, 12/25/2022 [DATE]: 
CLINICAL INDICATION: Low back pain, spinal stenosis, neurogenic claudication 
A search for DICOM formatted images was conducted for prior CT imaging studies 
completed at a non-affiliated media free facility.
TECHNIQUE: The lumbar spine was scanned from T12 through mid-sacrum without 
contrast on a high-resolution CT scanner using dose reduction techniques. 
Routing MPR reconstructions were performed. 3-D renderings were reconstructed on 
an independent workstation with concurrent physician supervision. . Count of 
known CT and Cardiac Nuclear Medicine studies performed in the previous 12 
months = 0. 
FINDINGS lumbar vertebral heights are intact. There is slight retrolisthesis at 
L2-3 and L3-4. Marked disc narrowing at L1-2 with moderate to marked narrowing 
at L5-S1. Vacuum phenomena at L5-S1. 
Axial images show multilevel canal stenosis which appears most pronounced at 
L4-5 followed closely by L3-4. There is stenosis of the S1 lateral recesses at 
L5-S1. At L2-3 there is mild canal stenosis. Canal is open at L1-2. There is 
foraminal stenosis, most pronounced bilaterally at L5-S1.

[Series 3: axial st · axial · 0.34mm/px · z∈[-349,-159]mm · 5 of 137 slices shown, 7 images]
[im 21/137  soft-tissue]
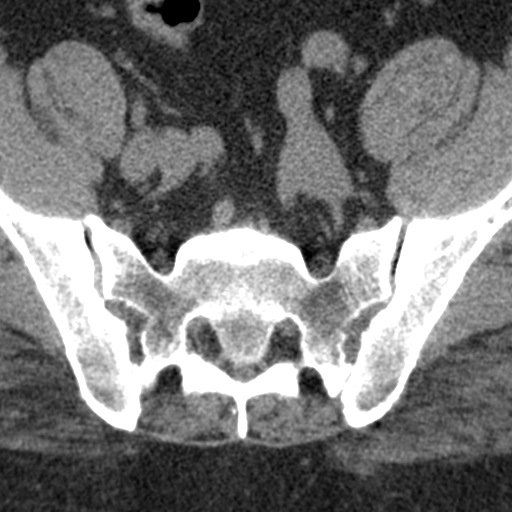
[im 21/137  bone]
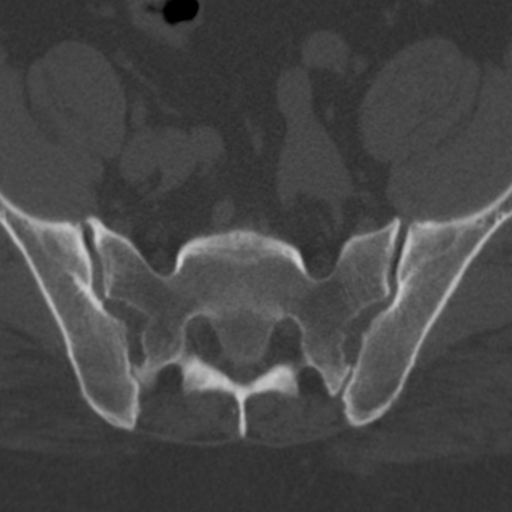
[im 42/137  bone]
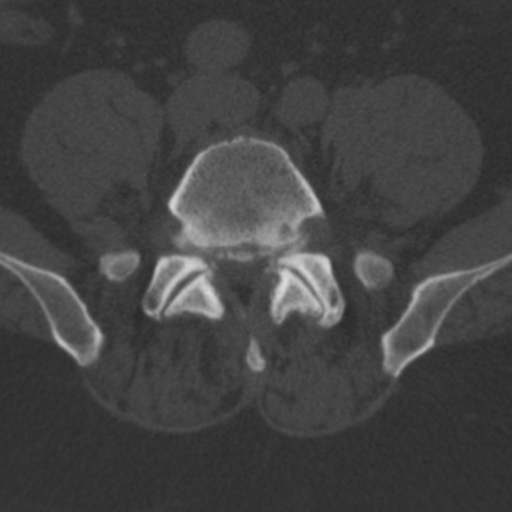
[im 74/137  bone]
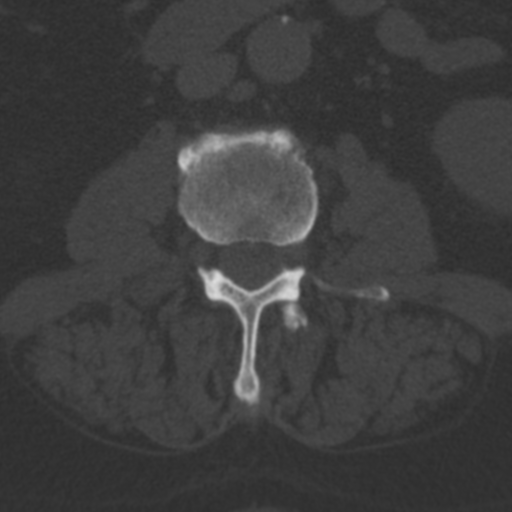
[im 95/137  bone]
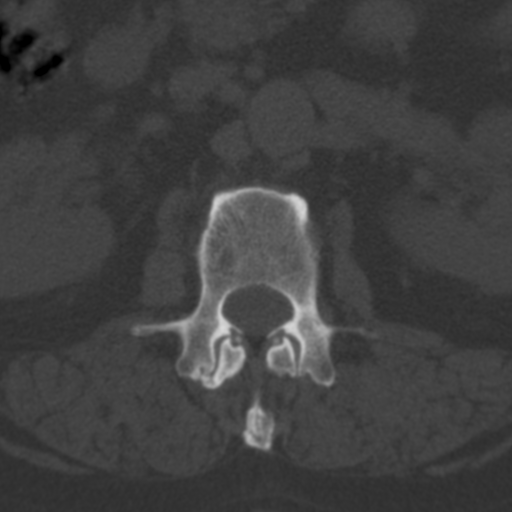
[im 116/137  soft-tissue]
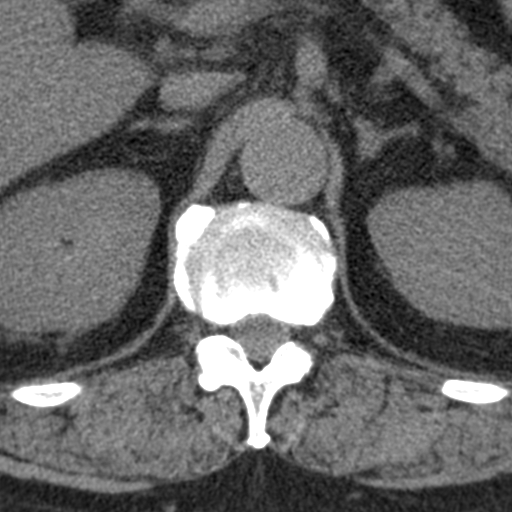
[im 116/137  bone]
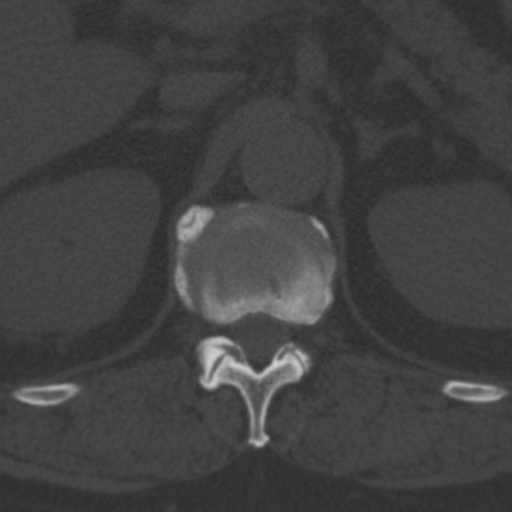

[Series 7: sag st · sagittal · 0.39mm/px · 5 of 81 slices shown, 6 images]
[im 27/81  bone]
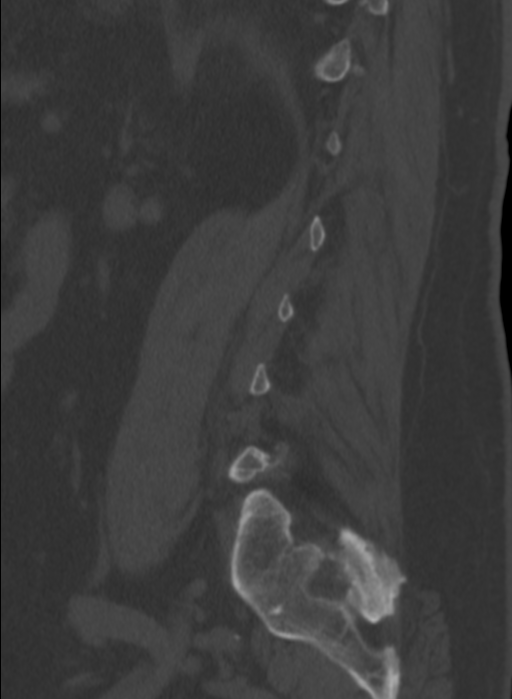
[im 34/81  bone]
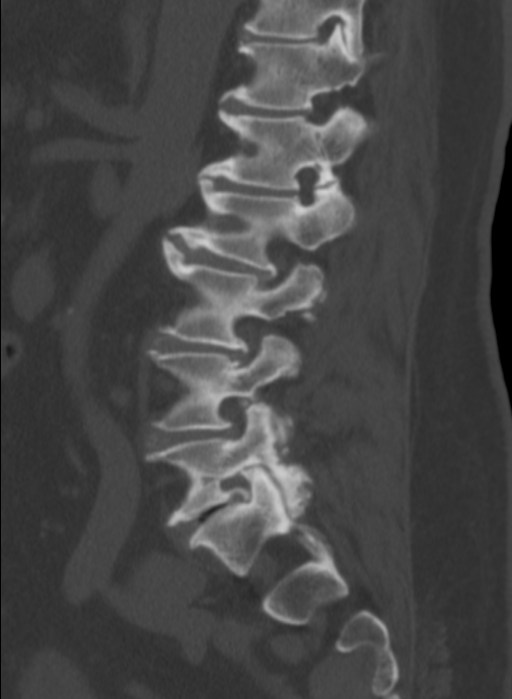
[im 41/81  soft-tissue]
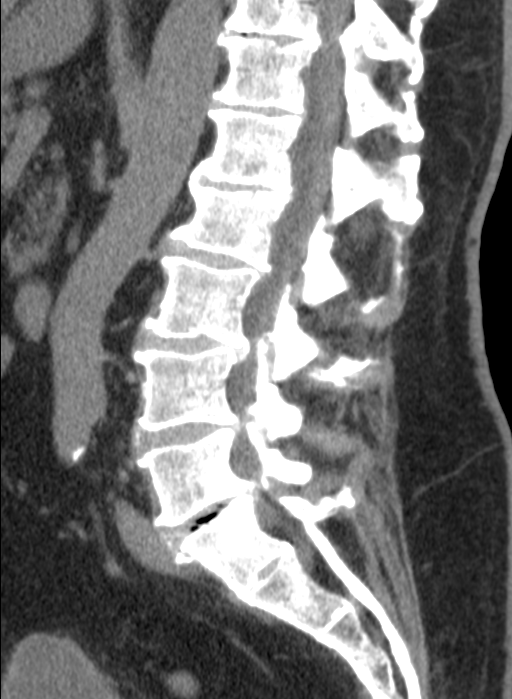
[im 41/81  bone]
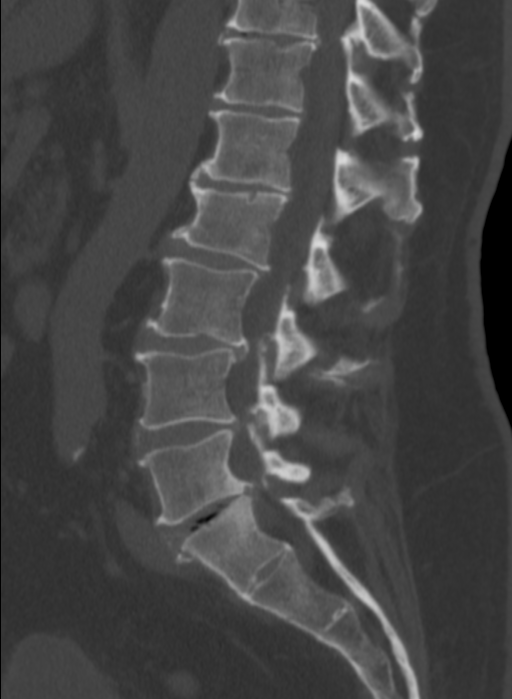
[im 47/81  bone]
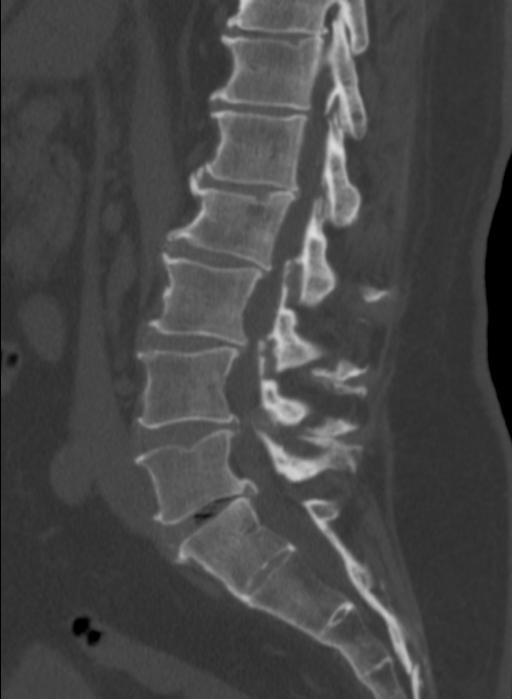
[im 54/81  bone]
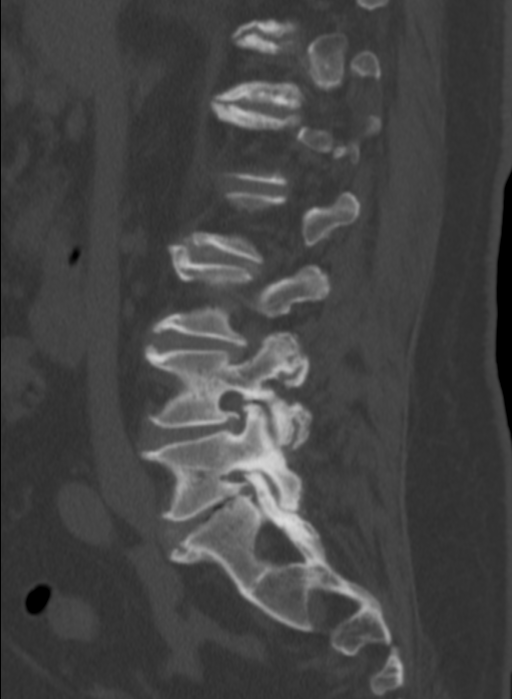

[Series 8: cor st · coronal · 0.39mm/px · 3 of 77 slices shown]
[im 16/77  bone]
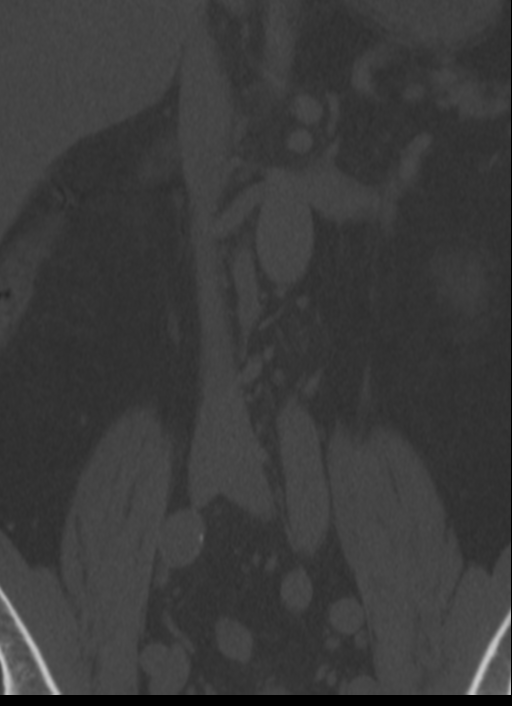
[im 31/77  bone]
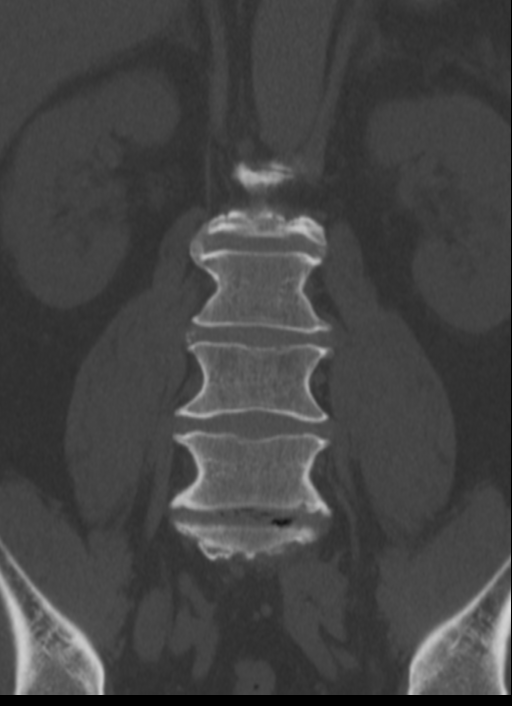
[im 46/77  bone]
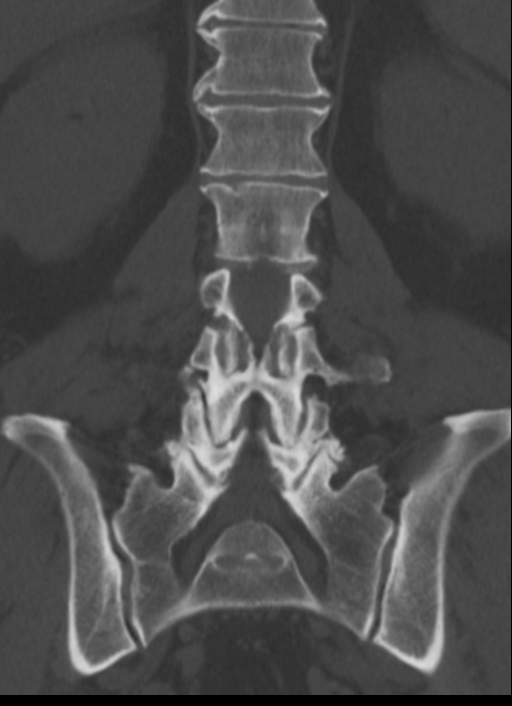

[13 of 33 positions shown; findings below may reference images not displayed]

IMPRESSION: Multilevel lumbar canal stenosis, most pronounced at L4-5 and L3-4. 
Marked bilateral foraminal impingement at L5-S1. 
No compression fracture. No bone destruction.  
RADIATION DOSE REDUCTION: All CT scans are performed using radiation dose 
reduction techniques, when applicable.  Technical factors are evaluated and 
adjusted to ensure appropriate moderation of exposure.  Automated dose 
management technology is applied to adjust the radiation doses to minimize 
exposure while achieving diagnostic quality images.

## 2023-07-24 IMAGING — MR MRI LUMBAR SPINE WITHOUT CONTRAST
6 of 10 series · 15 of 48 positions shown · IV contrast (gadolinium)
Comparison: CT lumbar spine December 25, 2022, MRI lumbar spine December 25, 2022.

________________________________________________________________________________________________ 
MRI LUMBAR SPINE WITHOUT CONTRAST, 07/24/2023 [DATE]: 
CLINICAL INDICATION: Spinal stenosis. History of lumbar surgery. Low back pain 
radiates to left leg with numbness in the left leg.
TECHNIQUE: Multiplanar, multiecho position MR images of the lumbar spine were 
performed without intravenous gadolinium enhancement. Patient was scanned on a 
1.5T magnet

[Series 201: survey · axial · 10.0mm · 1.25mm/px · z∈[-33,+201]mm · 2 of 10 slices shown]
[im 1/10]
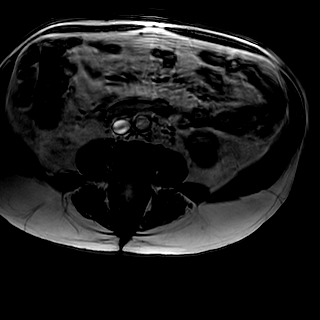
[im 10/10]
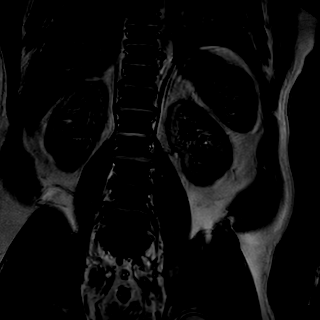

[Series 301: t2w_cor-surv · coronal · 6.0mm · 0.62mm/px · 2 of 11 slices shown]
[im 1/11]
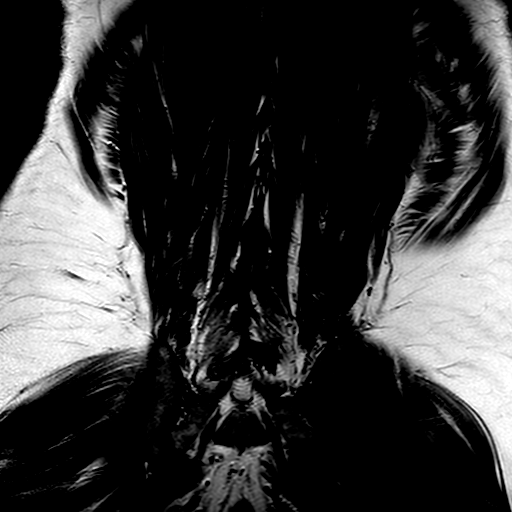
[im 11/11]
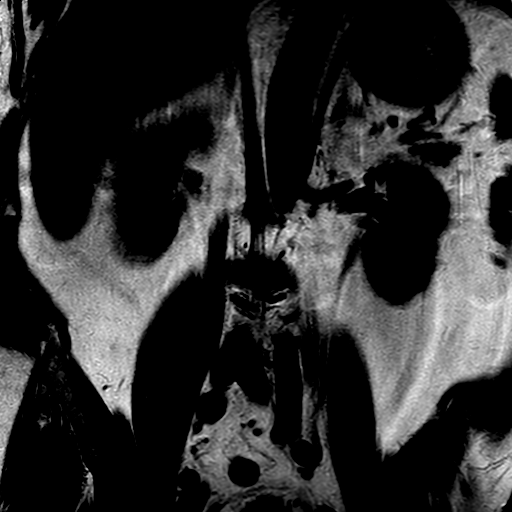

[Series 401: t1_tse_sag · sagittal · 4.0mm · 0.32mm/px · 2 of 19 slices shown]
[im 1/19]
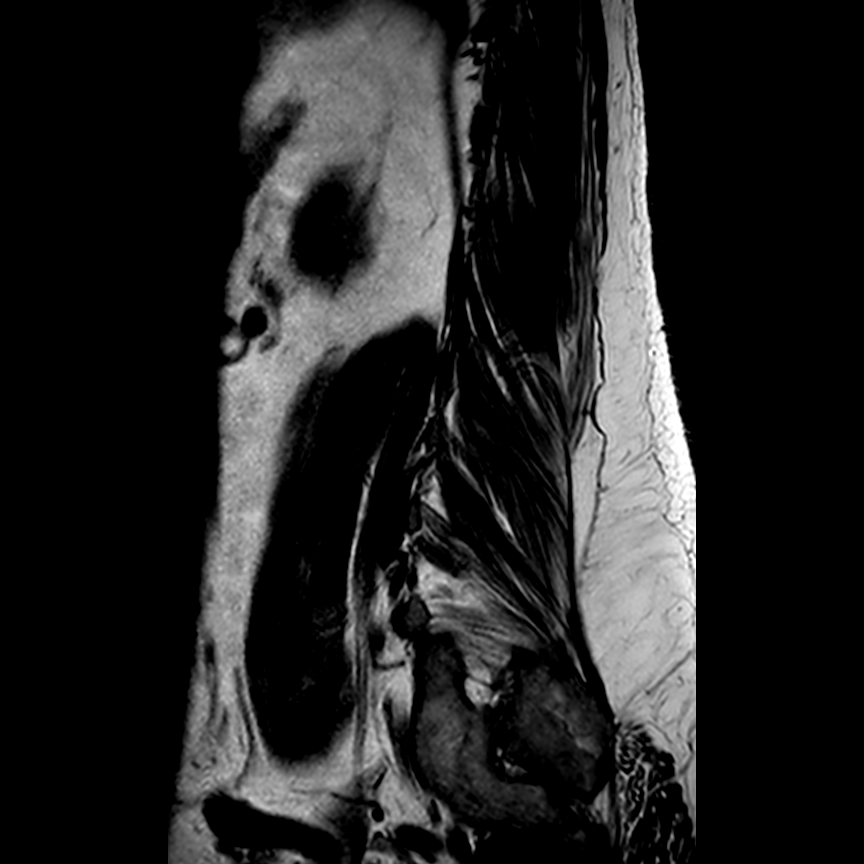
[im 19/19]
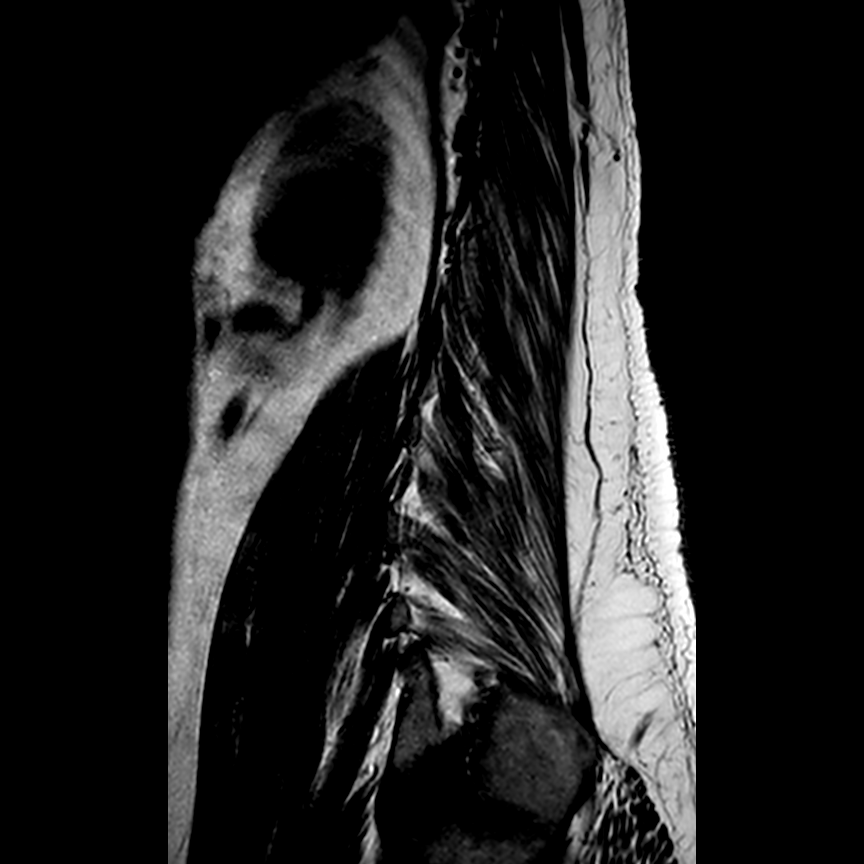

[Series 502: (id)_mdixon_tse · sagittal · 4.0mm · 0.43mm/px · 2 of 19 slices shown]
[im 1/19]
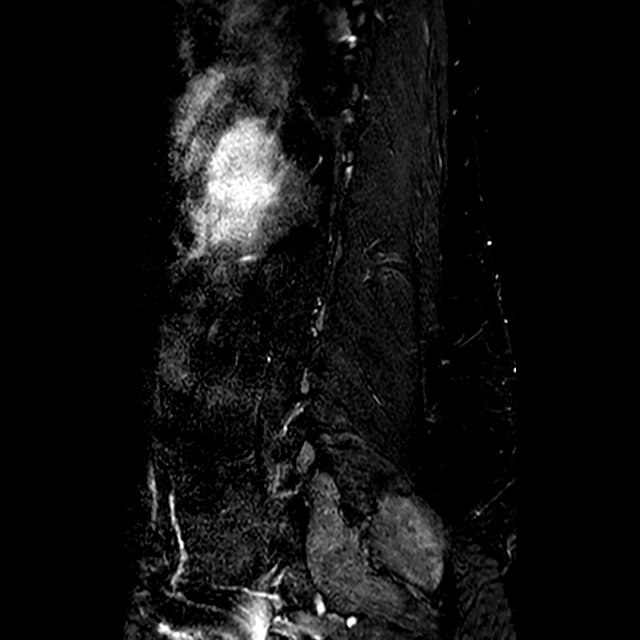
[im 19/19]
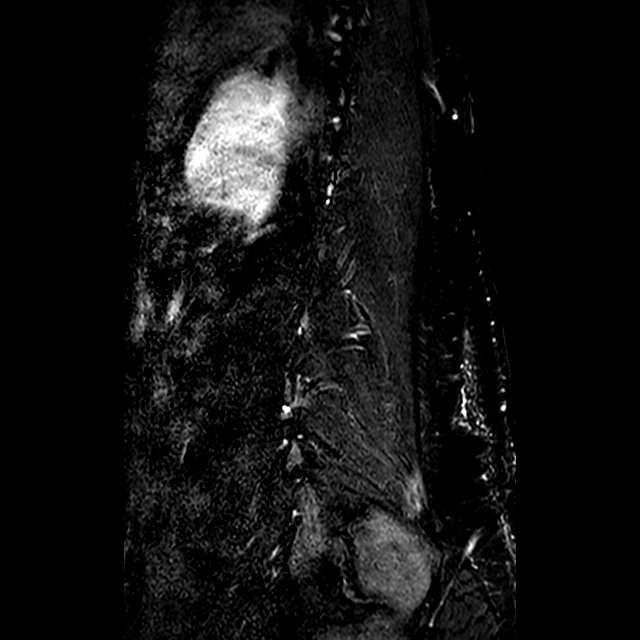

[Series 801: T1 · axial · 4.0mm · 0.39mm/px · z∈[-100,+83]mm · 5 of 42 slices shown]
[im 1/42]
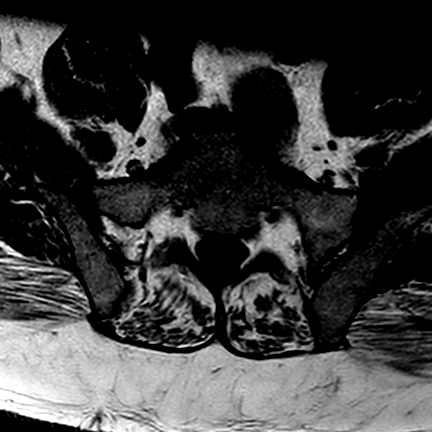
[im 11/42]
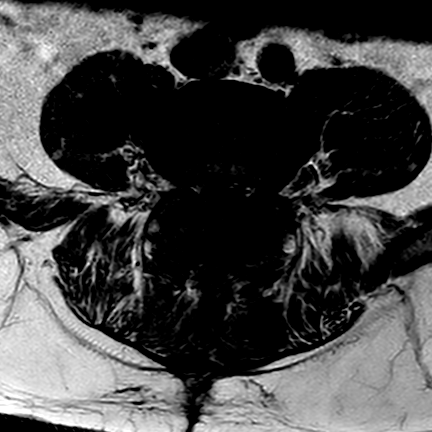
[im 21/42]
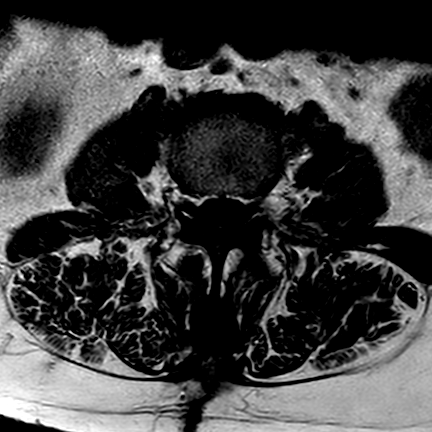
[im 31/42]
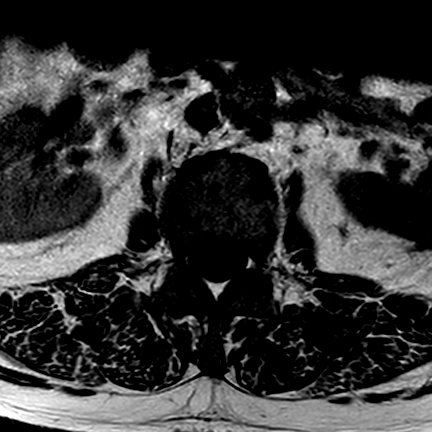
[im 42/42]
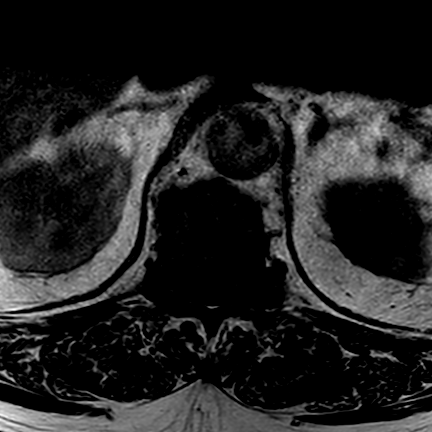

[Series 901: STIR · sagittal · 4.0mm · 0.43mm/px · 2 of 19 slices shown]
[im 1/19]
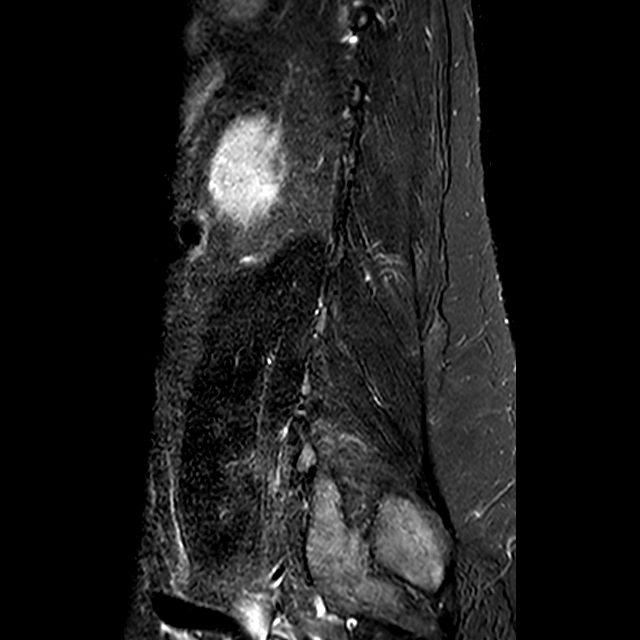
[im 19/19]
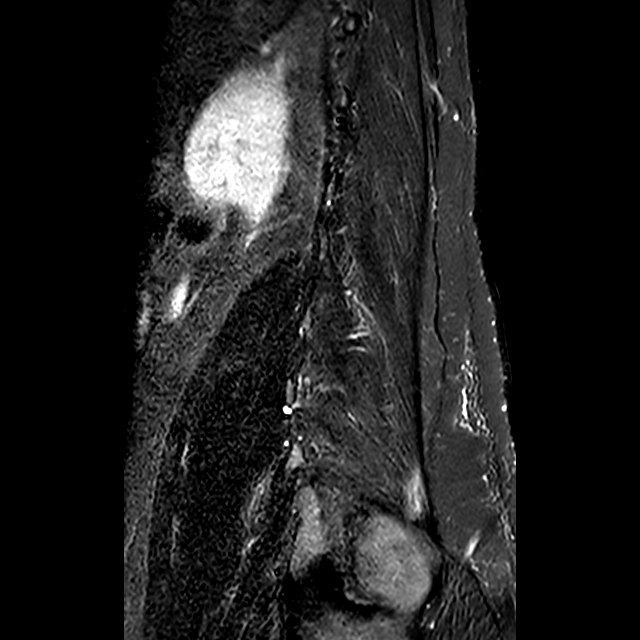

[15 of 48 positions shown; findings below may reference images not displayed]

FINDINGS: Interval laminectomy L3-L4, L4-L5 and L5-S1. 
-------------------------------------------------------------------------------- 
------ 
GENERAL: 
Nomenclature is based on 5 lumbar type vertebral bodies.     
ALIGNMENT: Normal coronal alignment. Loss of the normal lumbar lordosis with 
grade 1 retrolisthesis L2 on L3 and L3 on L4. Stable. 
VERTEBRAL BODY HEIGHT: Stable mild chronic superior endplate compression 
fracture of L2. Associated Schmorls node.  
MARROW SIGNAL: No focal suspect signal abnormality. 
CORD SIGNAL: Normal distal spinal cord. Thin fatty filum terminale. Conus 
medullaris terminates at L2. 
ADDITIONAL FINDINGS: There is a fluid collection in the laminectomy bed 
posterior to the L4 level. It measures 1.9 cm AP by 0.9 cm transverse by 2.8 cm 
craniocaudal. No definite communication to the thecal sac. 
Modic I-II: L2-L3, L4-L5. 
Ligamentum Flavum > 2.5 mm: All except the laminectomy sites. 
-------------------------------------------------------------------------------- 
------ 
SEGMENTAL: 
T10-T11: Loss of disc height and signal. Mild annular bulge with mild canal 
stenosis. Stable. 
T11-T12: Loss of disc height and signal. Schmorls node. Ligamentum flavum 
hypertrophy with facet arthropathy. Moderate canal stenosis with ventral and 
dorsal cord indentation. Patent left foramen. Mild right foraminal narrowing. 
Stable. 
T12-L1: Loss of disc height and signal. Mild annular bulge. Canal and foramina 
are patent. Mild right-sided facet arthropathy. Stable. 
L1-L2: Loss of disc height and signal with Schmorls nodes. Minimal annular 
bulge. Canal and foramina are patent. Mild facet arthropathy. Stable. 
L2-L3: Loss of disc height and signal. Mild canal stenosis from annular bulge. 
Mild facet arthropathy. Foramina patent. Stable. 
L3-L4: Loss of disc height posterior early. Loss of disc signal. Annular bulge. 
Canal patent. Mild narrowing of the lateral recesses bilaterally. Mild facet 
arthropathy. Foramina are mildly narrowed bilaterally. Canal demonstrate better 
patency in the interval. Foramina stable. 
L4-L5: Loss of disc signal with mild loss of disc height posteriorly. Schmorls 
node. Minimal annular bulge. Canal patent. Facet arthropathy. Moderate left and 
mild right foraminal narrowing. Canal demonstrates better patency in the 
interval. Foramina stable. 
L5-S1: Loss of disc height and signal. Mild diffuse annular bulge. Canal patent 
with posterior decompression. Mild facet arthropathy. Severe left and moderately 
severe right foraminal narrowing. Canal demonstrate better patency in the 
interval. Foramina stable. 
-------------------------------------------------------------------------------- 
------
IMPRESSION: Interval laminectomy L3-L4 through L5-S1. Fluid collection of laminectomy bed as 
detailed above. Stable chronic L2 compression fracture. 
T11-T12, stable moderate canal stenosis. 
Improved patency of the canal at L3-L4 through L5-S1. 
L3-L4, mild narrowing of the lateral recesses bilaterally. Moderate left 
foraminal narrowing L4-L5. Severe left foraminal narrowing L5-S1. No other 
potential explanation for left leg radiculopathy.
